# Patient Record
Sex: Female | Born: 1980 | Race: White | Hispanic: No | Marital: Married | Smoking: Never smoker
Health system: Southern US, Community
[De-identification: ages and names within clinical notes are randomized; demographics above are authoritative.]

## PROBLEM LIST (undated history)

## (undated) ENCOUNTER — Inpatient Hospital Stay (HOSPITAL_COMMUNITY): Payer: BLUE CROSS/BLUE SHIELD | Admitting: Obstetrics and Gynecology

## (undated) DIAGNOSIS — R0600 Dyspnea, unspecified: Secondary | ICD-10-CM

## (undated) DIAGNOSIS — I471 Supraventricular tachycardia: Secondary | ICD-10-CM

## (undated) DIAGNOSIS — I4719 Other supraventricular tachycardia: Secondary | ICD-10-CM

## (undated) HISTORY — DX: Other supraventricular tachycardia: I47.19

## (undated) HISTORY — DX: Dyspnea, unspecified: R06.00

## (undated) HISTORY — DX: Supraventricular tachycardia: I47.1

---

## 2003-04-21 ENCOUNTER — Other Ambulatory Visit: Admission: RE | Admit: 2003-04-21 | Discharge: 2003-04-21 | Payer: Self-pay | Admitting: Obstetrics and Gynecology

## 2004-05-30 ENCOUNTER — Other Ambulatory Visit: Admission: RE | Admit: 2004-05-30 | Discharge: 2004-05-30 | Payer: Self-pay | Admitting: Obstetrics and Gynecology

## 2005-08-27 ENCOUNTER — Other Ambulatory Visit: Admission: RE | Admit: 2005-08-27 | Discharge: 2005-08-27 | Payer: Self-pay | Admitting: Obstetrics and Gynecology

## 2007-04-20 ENCOUNTER — Ambulatory Visit: Payer: Self-pay | Admitting: Internal Medicine

## 2007-04-26 ENCOUNTER — Ambulatory Visit: Payer: Self-pay | Admitting: Cardiology

## 2007-06-03 ENCOUNTER — Ambulatory Visit: Payer: Self-pay | Admitting: Internal Medicine

## 2007-09-28 ENCOUNTER — Ambulatory Visit: Payer: Self-pay | Admitting: Internal Medicine

## 2008-09-14 ENCOUNTER — Ambulatory Visit: Payer: Self-pay | Admitting: Internal Medicine

## 2009-09-06 DIAGNOSIS — I471 Supraventricular tachycardia: Secondary | ICD-10-CM | POA: Insufficient documentation

## 2009-09-06 DIAGNOSIS — I498 Other specified cardiac arrhythmias: Secondary | ICD-10-CM

## 2009-09-07 ENCOUNTER — Ambulatory Visit: Payer: Self-pay | Admitting: Internal Medicine

## 2009-11-07 ENCOUNTER — Telehealth (INDEPENDENT_AMBULATORY_CARE_PROVIDER_SITE_OTHER): Payer: Self-pay | Admitting: *Deleted

## 2010-06-28 ENCOUNTER — Ambulatory Visit: Payer: Self-pay | Admitting: Internal Medicine

## 2010-07-21 ENCOUNTER — Inpatient Hospital Stay (HOSPITAL_COMMUNITY): Admission: AD | Admit: 2010-07-21 | Discharge: 2010-07-24 | Payer: Self-pay | Admitting: Obstetrics and Gynecology

## 2010-10-29 NOTE — Assessment & Plan Note (Signed)
Summary: f69m/per pt call she is due to have a baby on 10/15/lg  Medications Added PRE-NATAL FORMULA  TABS (PRENATAL MULTIVIT-MIN-FE-FA) 1  tab by mouth once daily        CC:  check up.  History of Present Illness: Vickie Short is seen in followup for  left atrial tachycardia.   She also has a history of intermittent elevations of blood pressure. interestingly, during her pregnancy which is about to be complete, her blood pressure and her palpitations have been quiescent     Current Medications (verified): 1)  Pre-Natal Formula  Tabs (Prenatal Multivit-Min-Fe-Fa) .Marland Kitchen.. 1  Tab By Mouth Once Daily  Allergies: No Known Drug Allergies  Past History:  Past Medical History: Last updated: 09/06/2009  1. Left atrial tachycardia.   2. Some dyspnea associated with #1.   Past Surgical History: Last updated: 09/06/2009 NONE  Family History: Last updated: 09/06/2009  There   is no family history of atrial fibrillation or sudden death apart from   her father, who is status post a failed atrial ablation procedure in   1992 by Dr. Moody Bruins.   Social History: Last updated: 09/06/2009   She is to get married, herself, in the fall; her fiance is a   Counselling psychologist.  She notes no effects from caffeine, over-the-counter   cold medicines, or chocolate.      Vital Signs:  Patient profile:   30 year old female Height:      69 inches Weight:      196 pounds BMI:     29.05 Pulse rate:   78 / minute Resp:     14 per minute BP sitting:   114 / 77  (left arm)  Vitals Entered By: Kem Parkinson (June 28, 2010 11:01 AM)  Physical Exam  General:  The patient was alert and oriented in no acute distress. HEENT Normal.  Neck veins were flat, carotids were brisk.  Lungs were clear.  Heart sounds were regular without murmurs or gallops.  Abdomen is gravid  There is no clubbing cyanosis or edema. Skin Warm and dry    Impression & Recommendations:  Problem # 1:  ATRIAL  TACHYCARDIA (ICD-427.89) the patient's symptoms have been quiet during her pregnancy. She is currently not taking a tubule. She is going to plan to breast-feed. I would anticipate that likely she will maintain her "pregnancy" state and hopefully it will keep her rhythms quiet also. The following medications were removed from the medication list:    Acebutolol Hcl 200 Mg Caps (Acebutolol hcl) .Marland Kitchen... Take one tablet once daily  Patient Instructions: 1)  Your physician recommends that you continue on your current medications as directed. Please refer to the Current Medication list given to you today. 2)  Your physician wants you to follow-up in: 1 year  You will receive a reminder letter in the mail two months in advance. If you don't receive a letter, please call our office to schedule the follow-up appointment.

## 2010-10-29 NOTE — Progress Notes (Signed)
   Recieved request from [facility] [employer] forwarded to [place] for processing [facility]  November 07, 2009 1:06 PM

## 2010-12-11 LAB — RH IMMUNE GLOB WKUP(>/=20WKS)(NOT WOMEN'S HOSP): Unit division: 0

## 2010-12-11 LAB — CBC
HCT: 32.8 % — ABNORMAL LOW (ref 36.0–46.0)
Hemoglobin: 11.1 g/dL — ABNORMAL LOW (ref 12.0–15.0)
Hemoglobin: 12.7 g/dL (ref 12.0–15.0)
MCH: 31 pg (ref 26.0–34.0)
MCH: 31.5 pg (ref 26.0–34.0)
MCHC: 33.8 g/dL (ref 30.0–36.0)
MCHC: 33.8 g/dL (ref 30.0–36.0)
MCV: 91.9 fL (ref 78.0–100.0)
MCV: 93 fL (ref 78.0–100.0)
RBC: 4.1 MIL/uL (ref 3.87–5.11)

## 2011-02-11 NOTE — Assessment & Plan Note (Signed)
[facility]                         ELECTROPHYSIOLOGY OFFICE NOTE   NAME:Nase, TIEISHA DARDEN                      MRN:          [id number]  DATE:09/28/2007                            DOB:          August 25, 1981    Ms. Marianne Sofia Altizer, is doing pretty well on her p.r.n. verapamil for  her left atrial tachycardia.  We discussed a number of issues including  pregnancy, flying, and long-term consequences.  I told her that I am not  sure that any of those are going to be issues.  There may be some  association between atrial tachycardia and atrial fibrillation, but at  her age, I think that would not be significant.  As long as she  minimally symptomatic, I am not sure that I would pursue anything other  than her current therapy.  As relates to pregnancy, there is some  anticipation that tachycardia will get worse in pregnancy, particularly  AV reentrant arrhythmias, and this may well apply to atrial tachycardias  as well, especially given the blood volume increases associated with  pregnancy.   We will plan to see her again in 12 months' time, and she is to call us  if there are any issues in the interim.     Duke Salvia, MD, [facility]  Electronically Signed    SCK/MedQ  DD: 09/28/2007  DT: 09/29/2007  Job #: [id number]

## 2011-02-11 NOTE — Letter (Signed)
September 14, 2008    [name], MD  [address]  [place], [place] [address]   [name], [name]  MRN:  [id number]  /  DOB:  11-06-1980   [name],   Vickie Short came in with her husband today in followup for her left  atrial tachycardia.  She continues to do quite well on her acebutolol  with only rare palpitation.  She also has had problems with headaches  and in the past these have correlated with hypertension.  She identified  by taking her blood pressure with her father's blood pressure cuff.  It  is not clear whether this is continuing to be an issue as head feels  heavy, but I thought there is something worth doing as when we saw her  a year ago, her blood pressure was 143/89.  Her weight is down 6 pounds  since then her blood pressure is 124/64.  Her pulse is 63.  Her lungs  were clear.  Heart sounds were regular.  Neck veins were flat and  extremities were without edema.   I should note that her medication include acebutolol 200 b.i.d. which is  related to pregnancy and in fact it is a category B drug.   Electrocardiogram demonstrated sinus rhythm 63 with intervals of  0.12/0.8/0.39, the axis was 60 degrees.  There is no evidence of  ventricular preexcitation.   IMPRESSION:  1. Left atrial tachycardiac.  2. (?) Hypertension.   Vickie Short, is doing well.  I just wanted to keep you abreast of  what were doing.  We will plan to see her again in a year.  She did  raise a question about whether she would like her father would be a  candidate for ablation, certainly I think that would be the case if her  symptoms were significant and if it became clear that he did not need  therapy anyway for her blood pressure.    Sincerely,      Duke Salvia, MD, [facility]  Electronically Signed    SCK/MedQ  DD: 09/14/2008  DT: 09/15/2008  Job #: [id number]

## 2011-02-11 NOTE — Assessment & Plan Note (Signed)
[facility]                         ELECTROPHYSIOLOGY OFFICE NOTE   Vickie Short, Vickie Short                    MRN:          [id number]  DATE:06/03/2007                            DOB:          April 09, 1981    Vickie Short came in today for follow-up of her left atrial  tachycardia.  We had tried her on Atenolol, metoprolol, Succinate and  Inderal.  The metoprolol succinate was terrible.  The atenolol was  associated with some vague chest pain and the Inderal was associated  with some fatigue.  Both the latter two had some improvement on her  palpitations.   She has decided that she would like to take medication p.r.n. for right  now.  I have given her a prescription for Inderal 20 and Verapamil 40 to  take one repeated 30 minutes later with another if she is having a bad  day.   I will plan to see her in December following her marriage in October.     Duke Salvia, MD, [facility]  Electronically Signed    SCK/MedQ  DD: 06/03/2007  DT: 06/04/2007  Job #: [id number]   cc:   Alta Corning, M.D.

## 2011-02-11 NOTE — Letter (Signed)
April 20, 2007    Alta Corning, M.D.  [facility]  [address]  [place], [place] [address]   RE:  MCKINZIE, SAKSA  MRN:  [id number]  /  DOB:  12/24/1980   [name],   It was a pleasure to your patient Newell Frater today in consultation  because of her irregular heartbeat.   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  Over the last number of months they have gotten worse;  and over the last number of weeks they have gotten worse still.  The  latter change concurrent with the introduction of birth control therapy.   The increasing symptoms relate both to the change in frequency as well  as shortness of breath associated with these.  She is most aware of  these in the evening, and then going to bed.  She actually finds that  these symptoms abate during exercise.   There are no symptoms of heart failure, specifically no dyspnea on  exertion, nocturnal dyspnea, or swelling.  There is no syncope.  There  is no family history of atrial fibrillation or sudden death apart from  her father, who is status post a failed atrial ablation procedure in  1992 by Dr. Moody Bruins.   She notes that these episodes are worse or increased with stress. Her  sister recently got married; she tried to lose weight to fit into her  dress.  She is to get married, herself, in the fall; her fiance is a  Counselling psychologist.  She notes no effects from caffeine, over-the-counter  cold medicines, or chocolate.   These episodes are occasionally associated with a discomfort in her neck  bilaterally.  As noted, these are unrelated to exercise.   Her social history is as noted previously.  She does not use cigarettes,  alcohol, or recreational drugs.   Her past surgical history is negative.  Her review of systems apart from  the above is negative.  Her family history is noncontributory.   Her current medications are none.  She has no known drug allergies.   On examination she is a young Caucasian female appearing her stated age  of 22.  Blood pressure is 126/82, pulse is 80.  Weight was 166.  Her  HEENT exam demonstrated no icterus or xanthoma.  The neck veins were  flat.  Carotids were brisk and full bilaterally without bruits.  The  back was without kyphosis or scoliosis.  The lungs were clear.  Heart  sounds were irregular with an early systolic murmur.  There were no  gallops. The abdomen was soft with active bowel sounds without midline  pulsations or hepatomegaly.  Femoral pulses were 2+, distal pulses were  intact.  There was no clubbing, cyanosis, or edema.  The neurological  exam was grossly normal.  Her skin was warm and dry.   Electrocardiogram dated, today, demonstrated a sinus rhythm at a rate of  about 70.  There were frequent atrial ectopics which mostly had a  negative vector in lead aVL; and was relatively isoelectric in lead V-1.   The strips that you so kindly sent also show something quite similar in  aVL that is mostly negative P waves in that lead.   IMPRESSION:  1. Left atrial tachycardia.  2. Some dyspnea associated with #1.   Jesusita Oka, Ms. Altizer's ectopy appears to be left atrial.  There is a ring  of fire for origin of atrial tachycardias that surround the mitral  annulus.  The upright vector in the inferior leads would suggest that  this is not the origin, that they may be coming from higher up; and in  fact may be coming from the pulmonary veins.  Her family history is  interesting, with her father, as there is increasing data related to  genetic contributions to atrial fibrillation.  The family history;  however, does not extend beyond these two, so it is hard to go too far  with that.   From a diagnostic point of view, I think, that it is important to make  sure that her heart is structurally normal; and we have taken the  liberty of  ordering an ultrasound.  She will be in town, here in  [place], the next two days so we can just set that up here for  simplicity's sake.  I have also given her prescriptions for 4 different  medications; to see if we can have a therapeutic impact.  Significantly  these include Inderal LA 60, atenolol 25, metoprolol succinate 25 and/or  verapamil 120.  I shall take them in some random order, and see if any  of these do not help ameliorate the situation.   I will plan to see her back in about four to five weeks to see how we  are doing.  She is to call if she has any more problems.   Jesusita Oka, thank you very much for asking [place] to see her.    Sincerely,      Duke Salvia, MD, [facility]  Electronically Signed    SCK/MedQ  DD: 04/20/2007  DT: 04/21/2007  Job #: [id number]

## 2011-03-27 ENCOUNTER — Encounter: Payer: Self-pay | Admitting: Internal Medicine

## 2011-08-27 ENCOUNTER — Ambulatory Visit: Payer: Self-pay | Admitting: Internal Medicine

## 2011-09-30 NOTE — L&D Delivery Note (Signed)
Delivery Note At 10:38 AM a viable and healthy female was delivered via Vaginal, Spontaneous Delivery (Presentation: Middle Occiput Anterior).  APGAR: 6, 9; weight 8 lb 15.2 oz (4060 g).   Placenta status: Intact, Spontaneous.  Cord: 3 vessels with a loose nuchal cord x1  Anesthesia: Epidural  Episiotomy: None Lacerations: 2nd degree;Perineal Suture Repair: 3.0 vicryl Est. Blood Loss (mL): 350 cc  Mom to postpartum.  Baby to nursery-stable.  Royce Stegman H. 09/23/2012, 11:04 AM

## 2011-10-14 ENCOUNTER — Ambulatory Visit (INDEPENDENT_AMBULATORY_CARE_PROVIDER_SITE_OTHER): Payer: BC Managed Care – PPO | Admitting: Internal Medicine

## 2011-10-14 ENCOUNTER — Encounter: Payer: Self-pay | Admitting: Internal Medicine

## 2011-10-14 VITALS — BP 110/78 | HR 66 | Ht 69.0 in | Wt 169.0 lb

## 2011-10-14 DIAGNOSIS — I498 Other specified cardiac arrhythmias: Secondary | ICD-10-CM

## 2011-10-14 NOTE — Progress Notes (Signed)
   HPI  Vickie Short is a 31 y.o. female seen in followup for left atrial tachycardia.  She also has a history of intermittent elevations of blood pressure. interestingly, during her pregnancy which is about to be complete, her blood pressure and her palpitations have been quiescent   Past Medical History  Diagnosis Date  . Atrial tachycardia     left atrial  . Dyspnea     associated with #1    No past surgical history on file.  Current Outpatient Prescriptions  Medication Sig Dispense Refill  . Prenatal Multivit-Min-Fe-FA (PRE-NATAL FORMULA) TABS Take 1 tablet by mouth daily.          No Known Allergies  Review of Systems negative except from HPI and PMH  Physical Exam BP 110/78  Pulse 66  Ht 5\' 9"  (1.753 m)  Wt 169 lb (76.658 kg)  BMI 24.96 kg/m2 Well developed and nourished in no acute distress HENT normal Neck supple with JVP-flat Carotids brisk and full without bruits Clear Regular rate and rhythm, no murmurs or gallops Abd-soft with active BS without hepatomegaly No Clubbing cyanosis edema Skin-warm and dry A & Oriented  Grossly normal sensory and motor function   Assessment and  Plan

## 2011-10-14 NOTE — Assessment & Plan Note (Signed)
Stable

## 2012-02-03 LAB — OB RESULTS CONSOLE GC/CHLAMYDIA: Chlamydia: NEGATIVE

## 2012-03-03 LAB — OB RESULTS CONSOLE RUBELLA ANTIBODY, IGM: Rubella: IMMUNE

## 2012-03-03 LAB — OB RESULTS CONSOLE HIV ANTIBODY (ROUTINE TESTING): HIV: NONREACTIVE

## 2012-03-03 LAB — OB RESULTS CONSOLE RPR: RPR: NONREACTIVE

## 2012-03-03 LAB — OB RESULTS CONSOLE HEPATITIS B SURFACE ANTIGEN: Hepatitis B Surface Ag: NEGATIVE

## 2012-09-22 ENCOUNTER — Inpatient Hospital Stay (HOSPITAL_COMMUNITY)
Admission: AD | Admit: 2012-09-22 | Discharge: 2012-09-24 | DRG: 373 | Disposition: A | Discharge status: Home / Self Care | Payer: BC Managed Care – PPO | Source: Ambulatory Visit | Attending: Obstetrics & Gynecology | Admitting: Obstetrics & Gynecology

## 2012-09-22 ENCOUNTER — Encounter (HOSPITAL_COMMUNITY): Payer: Self-pay | Admitting: *Deleted

## 2012-09-22 DIAGNOSIS — O99892 Other specified diseases and conditions complicating childbirth: Secondary | ICD-10-CM | POA: Diagnosis present

## 2012-09-22 DIAGNOSIS — Z22330 Carrier of Group B streptococcus: Secondary | ICD-10-CM

## 2012-09-22 DIAGNOSIS — O480 Post-term pregnancy: Principal | ICD-10-CM | POA: Diagnosis present

## 2012-09-22 LAB — CBC
HCT: 34.2 % — ABNORMAL LOW (ref 36.0–46.0)
Hemoglobin: 11.4 g/dL — ABNORMAL LOW (ref 12.0–15.0)
MCH: 29.6 pg (ref 26.0–34.0)
MCV: 88.8 fL (ref 78.0–100.0)
Platelets: 180 10*3/uL (ref 150–400)
RBC: 3.85 MIL/uL — ABNORMAL LOW (ref 3.87–5.11)

## 2012-09-22 LAB — ABO/RH: ABO/RH(D): A NEG

## 2012-09-22 MED ORDER — OXYTOCIN BOLUS FROM INFUSION: 500.0000 mL | INTRAVENOUS | Status: DC

## 2012-09-22 MED ORDER — PENICILLIN G POTASSIUM 5000000 UNITS IJ SOLR
5.0000 10*6.[IU] | Freq: Once | INTRAVENOUS | Status: AC
  Administered 2012-09-22: 5 10*6.[IU] via INTRAVENOUS
  Filled 2012-09-22: qty 5

## 2012-09-22 MED ORDER — OXYTOCIN 40 UNITS IN LACTATED RINGERS INFUSION - SIMPLE MED
1.0000 m[IU]/min | INTRAVENOUS | Status: DC
  Filled 2012-09-22: qty 1000

## 2012-09-22 MED ORDER — OXYCODONE-ACETAMINOPHEN 5-325 MG PO TABS: 1.0000 | ORAL_TABLET | ORAL | Status: DC | PRN

## 2012-09-22 MED ORDER — LACTATED RINGERS IV SOLN: 500.0000 mL | INTRAVENOUS | Status: DC | PRN

## 2012-09-22 MED ORDER — LACTATED RINGERS IV SOLN
INTRAVENOUS | Status: DC
  Administered 2012-09-22 – 2012-09-23 (×3): via INTRAVENOUS

## 2012-09-22 MED ORDER — MISOPROSTOL 25 MCG QUARTER TABLET
25.0000 ug | ORAL_TABLET | ORAL | Status: DC | PRN
  Administered 2012-09-22 – 2012-09-23 (×2): 25 ug via VAGINAL
  Filled 2012-09-22 (×2): qty 0.25

## 2012-09-22 MED ORDER — ACETAMINOPHEN 325 MG PO TABS: 650.0000 mg | ORAL_TABLET | ORAL | Status: DC | PRN

## 2012-09-22 MED ORDER — CITRIC ACID-SODIUM CITRATE 334-500 MG/5ML PO SOLN: 30.0000 mL | ORAL | Status: DC | PRN

## 2012-09-22 MED ORDER — IBUPROFEN 600 MG PO TABS: 600.0000 mg | ORAL_TABLET | Freq: Four times a day (QID) | ORAL | Status: DC | PRN

## 2012-09-22 MED ORDER — ONDANSETRON HCL 4 MG/2ML IJ SOLN: 4.0000 mg | Freq: Four times a day (QID) | INTRAMUSCULAR | Status: DC | PRN

## 2012-09-22 MED ORDER — LIDOCAINE HCL (PF) 1 % IJ SOLN
30.0000 mL | INTRAMUSCULAR | Status: DC | PRN
  Filled 2012-09-22: qty 30

## 2012-09-22 MED ORDER — OXYTOCIN 40 UNITS IN LACTATED RINGERS INFUSION - SIMPLE MED: 62.5000 mL/h | INTRAVENOUS | Status: DC

## 2012-09-22 MED ORDER — BUTORPHANOL TARTRATE 1 MG/ML IJ SOLN: 1.0000 mg | INTRAMUSCULAR | Status: DC | PRN

## 2012-09-22 MED ORDER — TERBUTALINE SULFATE 1 MG/ML IJ SOLN: 0.2500 mg | Freq: Once | INTRAMUSCULAR | Status: AC | PRN

## 2012-09-22 MED ORDER — PENICILLIN G POTASSIUM 5000000 UNITS IJ SOLR
2.5000 10*6.[IU] | INTRAVENOUS | Status: DC
  Administered 2012-09-23 (×3): 2.5 10*6.[IU] via INTRAVENOUS
  Filled 2012-09-22 (×5): qty 2.5

## 2012-09-22 NOTE — H&P (Signed)
31 y.o. G2P1001  Estimated Date of Delivery: None noted. admitted at [redacted] weeks gestation for induction.  Prenatal Transfer Tool  Maternal Diabetes: No Genetic Screening: Declined Maternal Ultrasounds/Referrals: Normal Fetal Ultrasounds or other Referrals:  None Maternal Substance Abuse:  No Significant Maternal Medications:  None Significant Maternal Lab Results: Lab values include: Group B Strep positive, Rh negative (husband Rh negative as well). Other Significant Pregnancy Complications:  Post dates  Afebrile, VSS Heart and Lungs: No active disease Abdomen: soft, gravid, EFW possible LGA. Cervical exam:  1/60 vtx -1/-2  Impression: Post dates pregnancy with unfavorable cervix  Plan:  Cytotec/pitocin induction

## 2012-09-23 ENCOUNTER — Inpatient Hospital Stay (HOSPITAL_COMMUNITY): Payer: BC Managed Care – PPO | Admitting: Anesthesiology

## 2012-09-23 ENCOUNTER — Encounter (HOSPITAL_COMMUNITY): Payer: Self-pay | Admitting: *Deleted

## 2012-09-23 ENCOUNTER — Encounter (HOSPITAL_COMMUNITY): Payer: Self-pay | Admitting: Anesthesiology

## 2012-09-23 MED ORDER — FENTANYL 2.5 MCG/ML BUPIVACAINE 1/10 % EPIDURAL INFUSION (WH - ANES)
14.0000 mL/h | INTRAMUSCULAR | Status: DC
  Administered 2012-09-23: 14 mL/h via EPIDURAL
  Filled 2012-09-23: qty 125

## 2012-09-23 MED ORDER — SIMETHICONE 80 MG PO CHEW: 80.0000 mg | CHEWABLE_TABLET | ORAL | Status: DC | PRN

## 2012-09-23 MED ORDER — ONDANSETRON HCL 4 MG PO TABS: 4.0000 mg | ORAL_TABLET | ORAL | Status: DC | PRN

## 2012-09-23 MED ORDER — IBUPROFEN 600 MG PO TABS
600.0000 mg | ORAL_TABLET | Freq: Four times a day (QID) | ORAL | Status: DC
  Administered 2012-09-23 – 2012-09-24 (×5): 600 mg via ORAL
  Filled 2012-09-23 (×5): qty 1

## 2012-09-23 MED ORDER — SENNOSIDES-DOCUSATE SODIUM 8.6-50 MG PO TABS
2.0000 | ORAL_TABLET | Freq: Every day | ORAL | Status: DC
  Administered 2012-09-23: 2 via ORAL

## 2012-09-23 MED ORDER — PHENYLEPHRINE 40 MCG/ML (10ML) SYRINGE FOR IV PUSH (FOR BLOOD PRESSURE SUPPORT)
80.0000 ug | PREFILLED_SYRINGE | INTRAVENOUS | Status: DC | PRN
  Filled 2012-09-23: qty 5

## 2012-09-23 MED ORDER — ZOLPIDEM TARTRATE 5 MG PO TABS: 5.0000 mg | ORAL_TABLET | Freq: Every evening | ORAL | Status: DC | PRN

## 2012-09-23 MED ORDER — LANOLIN HYDROUS EX OINT: TOPICAL_OINTMENT | CUTANEOUS | Status: DC | PRN

## 2012-09-23 MED ORDER — EPHEDRINE 5 MG/ML INJ: 10.0000 mg | INTRAVENOUS | Status: DC | PRN

## 2012-09-23 MED ORDER — OXYCODONE-ACETAMINOPHEN 5-325 MG PO TABS
1.0000 | ORAL_TABLET | ORAL | Status: DC | PRN
  Administered 2012-09-24: 1 via ORAL
  Filled 2012-09-23: qty 1

## 2012-09-23 MED ORDER — BENZOCAINE-MENTHOL 20-0.5 % EX AERO
1.0000 "application " | INHALATION_SPRAY | CUTANEOUS | Status: DC | PRN
  Filled 2012-09-23 (×2): qty 56

## 2012-09-23 MED ORDER — PRENATAL MULTIVITAMIN CH
1.0000 | ORAL_TABLET | Freq: Every day | ORAL | Status: DC
  Administered 2012-09-24: 1 via ORAL
  Filled 2012-09-23: qty 1

## 2012-09-23 MED ORDER — DIPHENHYDRAMINE HCL 25 MG PO CAPS: 25.0000 mg | ORAL_CAPSULE | Freq: Four times a day (QID) | ORAL | Status: DC | PRN

## 2012-09-23 MED ORDER — WITCH HAZEL-GLYCERIN EX PADS: 1.0000 "application " | MEDICATED_PAD | CUTANEOUS | Status: DC | PRN

## 2012-09-23 MED ORDER — LIDOCAINE HCL (PF) 1 % IJ SOLN
INTRAMUSCULAR | Status: DC | PRN
  Administered 2012-09-23 (×4): 4 mL

## 2012-09-23 MED ORDER — LACTATED RINGERS IV SOLN
500.0000 mL | Freq: Once | INTRAVENOUS | Status: AC
  Administered 2012-09-23: 500 mL via INTRAVENOUS

## 2012-09-23 MED ORDER — DIBUCAINE 1 % RE OINT
1.0000 "application " | TOPICAL_OINTMENT | RECTAL | Status: DC | PRN
  Filled 2012-09-23: qty 28

## 2012-09-23 MED ORDER — TETANUS-DIPHTH-ACELL PERTUSSIS 5-2.5-18.5 LF-MCG/0.5 IM SUSP
0.5000 mL | Freq: Once | INTRAMUSCULAR | Status: AC
  Administered 2012-09-23: 0.5 mL via INTRAMUSCULAR
  Filled 2012-09-23: qty 0.5

## 2012-09-23 MED ORDER — EPHEDRINE 5 MG/ML INJ
10.0000 mg | INTRAVENOUS | Status: DC | PRN
  Filled 2012-09-23: qty 4

## 2012-09-23 MED ORDER — PHENYLEPHRINE 40 MCG/ML (10ML) SYRINGE FOR IV PUSH (FOR BLOOD PRESSURE SUPPORT): 80.0000 ug | PREFILLED_SYRINGE | INTRAVENOUS | Status: DC | PRN

## 2012-09-23 MED ORDER — ONDANSETRON HCL 4 MG/2ML IJ SOLN: 4.0000 mg | INTRAMUSCULAR | Status: DC | PRN

## 2012-09-23 MED ORDER — DIPHENHYDRAMINE HCL 50 MG/ML IJ SOLN: 12.5000 mg | INTRAMUSCULAR | Status: DC | PRN

## 2012-09-23 NOTE — Anesthesia Preprocedure Evaluation (Signed)
Anesthesia Evaluation  Patient identified by MRN, date of birth, ID band Patient awake    Reviewed: Allergy & Precautions, H&P , NPO status , Patient's Chart, lab work & pertinent test results, reviewed documented beta blocker date and time   History of Anesthesia Complications Negative for: history of anesthetic complications  Airway Mallampati: I TM Distance: >3 FB Neck ROM: full    Dental  (+) Teeth Intact   Pulmonary neg pulmonary ROS,  breath sounds clear to auscultation        Cardiovascular + dysrhythmias (h/o atrial tachycardia, stable, no meds) Rhythm:regular Rate:Normal     Neuro/Psych negative neurological ROS  negative psych ROS   GI/Hepatic negative GI ROS, Neg liver ROS,   Endo/Other  negative endocrine ROS  Renal/GU negative Renal ROS  negative genitourinary   Musculoskeletal   Abdominal   Peds  Hematology negative hematology ROS (+)   Anesthesia Other Findings   Reproductive/Obstetrics (+) Pregnancy                           Anesthesia Physical Anesthesia Plan  ASA: II  Anesthesia Plan: Epidural   Post-op Pain Management:    Induction:   Airway Management Planned:   Additional Equipment:   Intra-op Plan:   Post-operative Plan:   Informed Consent: I have reviewed the patients History and Physical, chart, labs and discussed the procedure including the risks, benefits and alternatives for the proposed anesthesia with the patient or authorized representative who has indicated his/her understanding and acceptance.     Plan Discussed with:   Anesthesia Plan Comments:         Anesthesia Quick Evaluation

## 2012-09-23 NOTE — Anesthesia Procedure Notes (Signed)
Epidural Patient location during procedure: OB Start time: 09/23/2012 6:38 AM  Staffing Performed by: anesthesiologist   Preanesthetic Checklist Completed: patient identified, site marked, surgical consent, pre-op evaluation, timeout performed, IV checked, risks and benefits discussed and monitors and equipment checked  Epidural Patient position: sitting Prep: site prepped and draped and DuraPrep Patient monitoring: continuous pulse ox and blood pressure Approach: midline Injection technique: LOR air  Needle:  Needle type: Tuohy  Needle gauge: 17 G Needle length: 9 cm and 9 Needle insertion depth: 4.5 cm Catheter type: closed end flexible Catheter size: 19 Gauge Catheter at skin depth: 9.5 cm Test dose: negative  Assessment Events: blood not aspirated, injection not painful, no injection resistance, negative IV test and no paresthesia  Additional Notes Discussed risk of headache, infection, bleeding, nerve injury and failed or incomplete block.  Patient voices understanding and wishes to proceed.  Epidural placed easily on first attempt.  Patient tolerated procedure well with no apparent complications.  Jasmine December, MD Reason for block:procedure for pain

## 2012-09-23 NOTE — Progress Notes (Signed)
Patient ID: Vickie Short, female   DOB: [birth year], 31 y.o.   MRN: [id number]  S: Comfortable, feeling some pressure  O: Filed Vitals:   09/23/12 0727 09/23/12 0732 09/23/12 0802 09/23/12 0830  BP: 128/72 124/70 114/65 129/74  Pulse: 75 75 91 84  Temp:   98 F (36.7 C)   TempSrc:   Oral   Resp: 18 20 20 20   Height:      Weight:      SpO2:       Cvx C/C/BBOW FHT 140-150 reactive toco Q2-3  AROM clear fluid  A/P Labor Down FWB reassuring

## 2012-09-23 NOTE — Anesthesia Postprocedure Evaluation (Signed)
  Anesthesia Post-op Note  Patient: Vickie Short  Procedure(s) Performed: * No procedures listed *  Patient Location: PACU and Mother/Baby  Anesthesia Type:Epidural  Level of Consciousness: awake, alert  and oriented  Airway and Oxygen Therapy: Patient Spontanous Breathing  Post-op Pain: none  Post-op Assessment: Post-op Vital signs reviewed, Patient's Cardiovascular Status Stable, No headache, No backache, No residual numbness and No residual motor weakness  Post-op Vital Signs: Reviewed and stable  Complications: No apparent anesthesia complications

## 2012-09-23 NOTE — Progress Notes (Signed)
Pt contracting too much to place another cytotec at this time, will continue to monitor uc pattern.

## 2012-09-24 LAB — CBC
HCT: 30.3 % — ABNORMAL LOW (ref 36.0–46.0)
Hemoglobin: 10.1 g/dL — ABNORMAL LOW (ref 12.0–15.0)
MCH: 29.7 pg (ref 26.0–34.0)
MCHC: 33.3 g/dL (ref 30.0–36.0)
RDW: 13.6 % (ref 11.5–15.5)

## 2012-09-24 MED ORDER — IBUPROFEN 600 MG PO TABS: 600.0000 mg | ORAL_TABLET | Freq: Four times a day (QID) | ORAL | Status: DC

## 2012-09-24 MED ORDER — OXYCODONE-ACETAMINOPHEN 5-325 MG PO TABS: 1.0000 | ORAL_TABLET | ORAL | Status: DC | PRN

## 2012-09-24 NOTE — Discharge Summary (Signed)
Obstetric Discharge Summary Reason for Admission: induction of labor Prenatal Procedures: none Intrapartum Procedures: spontaneous vaginal delivery Postpartum Procedures:none Complications-Operative and Postpartum: 2nd degree perineal laceration Hemoglobin  Date Value Range Status  09/24/2012 10.1* 12.0 - 15.0 g/dL Final     HCT  Date Value Range Status  09/24/2012 30.3* 36.0 - 46.0 % Final    Physical Exam:  General: alert and cooperative Lochia: appropriate Uterine Fundus: firm DVT Evaluation: No evidence of DVT seen on physical exam.  Discharge Diagnoses: Term Pregnancy-delivered  Discharge Information: Date: 09/24/2012 Activity: pelvic rest Diet: routine Medications: PNV, Ibuprofen, Colace and Percocet Condition: stable Instructions: refer to practice specific booklet Discharge to: home Follow-up Information    Follow up with Almon Hercules., MD. In 4 weeks.   Contact information:   [address] [place] [address] [phone]          Newborn Data: Live born female  Birth Weight: 8 lb 15.2 oz (4060 g) APGAR: 6, 9  Home with mother.  Vickie Short 09/24/2012, 9:46 AM

## 2013-10-21 ENCOUNTER — Other Ambulatory Visit: Payer: Self-pay

## 2013-10-21 ENCOUNTER — Other Ambulatory Visit: Payer: Self-pay | Admitting: Obstetrics and Gynecology

## 2013-10-21 DIAGNOSIS — N6325 Unspecified lump in the left breast, overlapping quadrants: Secondary | ICD-10-CM

## 2013-10-21 DIAGNOSIS — N6323 Unspecified lump in the left breast, lower outer quadrant: Secondary | ICD-10-CM

## 2013-10-21 DIAGNOSIS — N6320 Unspecified lump in the left breast, unspecified quadrant: Secondary | ICD-10-CM

## 2013-10-27 ENCOUNTER — Ambulatory Visit
Admission: RE | Admit: 2013-10-27 | Discharge: 2013-10-27 | Disposition: A | Discharge status: Home / Self Care | Payer: BC Managed Care – PPO | Source: Ambulatory Visit | Attending: Obstetrics and Gynecology | Admitting: Obstetrics and Gynecology

## 2013-10-27 DIAGNOSIS — N6325 Unspecified lump in the left breast, overlapping quadrants: Secondary | ICD-10-CM

## 2013-10-27 DIAGNOSIS — N6323 Unspecified lump in the left breast, lower outer quadrant: Secondary | ICD-10-CM

## 2013-10-27 DIAGNOSIS — N6320 Unspecified lump in the left breast, unspecified quadrant: Secondary | ICD-10-CM

## 2014-07-31 ENCOUNTER — Encounter (HOSPITAL_COMMUNITY): Payer: Self-pay | Admitting: *Deleted

## 2015-01-23 ENCOUNTER — Telehealth: Payer: Self-pay | Admitting: Internal Medicine

## 2015-01-23 NOTE — Telephone Encounter (Signed)
New message ° ° ° ° ° ° °Pt returning nurse call  °

## 2015-01-24 NOTE — Telephone Encounter (Signed)
Patient was calling about her dad Vickie Short, DOB: 09/25/41. See his chart for documentation of phone call.

## 2015-02-06 ENCOUNTER — Ambulatory Visit: Payer: Self-pay | Admitting: Cardiology

## 2015-02-21 ENCOUNTER — Encounter: Payer: Self-pay | Admitting: Internal Medicine

## 2015-02-21 ENCOUNTER — Ambulatory Visit (INDEPENDENT_AMBULATORY_CARE_PROVIDER_SITE_OTHER): Payer: BLUE CROSS/BLUE SHIELD | Admitting: Internal Medicine

## 2015-02-21 VITALS — BP 120/80 | HR 79 | Ht 68.0 in | Wt 180.4 lb

## 2015-02-21 DIAGNOSIS — I471 Supraventricular tachycardia: Secondary | ICD-10-CM

## 2015-02-21 NOTE — Progress Notes (Signed)
      No care team member to display   HPI  Vickie JourneyRebecca C Ortloff is a 34 y.o. female Seen in follow-up for left atrial tachycardia and elevated blood pressure.  She notes palpitations. They cause her to have a unusual breathing sensation when she takes her pulse she notes the skin which appears to be right on time.  She's also had episodes where she has a sense of weakness and takes her blood pressure and it is elevated, in the 150-160 range. it comes down rapidly with rest.  She has noted above seemed to be somewhat better with the elimination of caffeine.  Past Medical History  Diagnosis Date  . Dyspnea     associated with #1  . Atrial tachycardia     left atrial    History reviewed. No pertinent past surgical history.  No current outpatient prescriptions on file.   No current facility-administered medications for this visit.    No Known Allergies  Review of Systems negative except from HPI and PMH  Physical Exam BP 120/80 mmHg  Pulse 79  Ht 5\' 8"  (1.727 m)  Wt 180 lb 6.4 oz (81.829 kg)  BMI 27.44 kg/m2 Well developed and well nourished in no acute distress HENT normal E scleral and icterus clear Neck Supple Clear to ausculation  Regular rate and rhythm, no murmurs gallops or rub Soft with active bowel sounds No clubbing cyanosis no Edema Alert and oriented, grossly normal motor and sensory function Skin Warm and Dry  ECG demonstrates sinus rhythm at 79% intervals 14/08/37 Axis XXIII  Assessment and  Plan  Palpitations  Elevated blood pressure  She has noted that palpitations are much less with the decreasing caffeine. By her description these are more consistent with PVCs in the atrial tachycardia previously noted.  The other concern is her elevated blood pressure. We will have her take her blood pressures on a regular basis every week or 2 for the next 4 months and then will review them. I wonder whether the elevations being brief are related to  stress.

## 2015-02-21 NOTE — Patient Instructions (Signed)
Medication Instructions:  Your physician recommends that you continue on your current medications as directed. Please refer to the Current Medication list given to you today.   Labwork: None  Testing/Procedures: None  Follow-Up: Your physician wants you to follow-up in: October with Dr. Graciela HusbandsKlein. You will receive a reminder letter in the mail two months in advance. If you don't receive a letter, please call our office to schedule the follow-up appointment.   Any Other Special Instructions Will Be Listed Below (If Applicable). None

## 2015-07-13 LAB — OB RESULTS CONSOLE HEPATITIS B SURFACE ANTIGEN: Hepatitis B Surface Ag: NEGATIVE

## 2015-07-13 LAB — OB RESULTS CONSOLE RUBELLA ANTIBODY, IGM: Rubella: IMMUNE

## 2015-07-13 LAB — OB RESULTS CONSOLE ABO/RH: RH TYPE: NEGATIVE

## 2015-07-13 LAB — OB RESULTS CONSOLE HIV ANTIBODY (ROUTINE TESTING): HIV: NONREACTIVE

## 2015-07-13 LAB — OB RESULTS CONSOLE GC/CHLAMYDIA
CHLAMYDIA, DNA PROBE: NEGATIVE
GC PROBE AMP, GENITAL: NEGATIVE

## 2015-07-13 LAB — OB RESULTS CONSOLE ANTIBODY SCREEN: Antibody Screen: NEGATIVE

## 2015-07-13 LAB — OB RESULTS CONSOLE RPR: RPR: NONREACTIVE

## 2015-09-30 NOTE — L&D Delivery Note (Signed)
Patient was C/C/+3 and pushed for 8 minutes with epidural.   NSVD  female infant, Apgars 8,9, weight P.   The patient had Short second degree midline perineal laceration repaired with 2-0 vicryl R. Fundus was firm. EBL was expected amount. Placenta was delivered intact. Vagina was clear.  Baby was vigorous and doing skin to skin with mother.  Vickie Short

## 2015-12-20 LAB — OB RESULTS CONSOLE GBS: STREP GROUP B AG: POSITIVE

## 2016-01-05 IMAGING — MG MM DIGITAL DIAGNOSTIC BILAT
5 series · 5 of 5 positions shown · non-contrast
Comparison: None.

CLINICAL DATA: 32-year-old presenting with a palpable lump in the
lower outer periareolar left breast which the patient noticed
approximately 2 weeks ago, described as firm and painless. Baseline
examination. No family history of breast cancer.

EXAM:
DIGITAL DIAGNOSTIC  BILATERAL MAMMOGRAM WITH CAD
ULTRASOUND LEFT BREAST

[R CC]
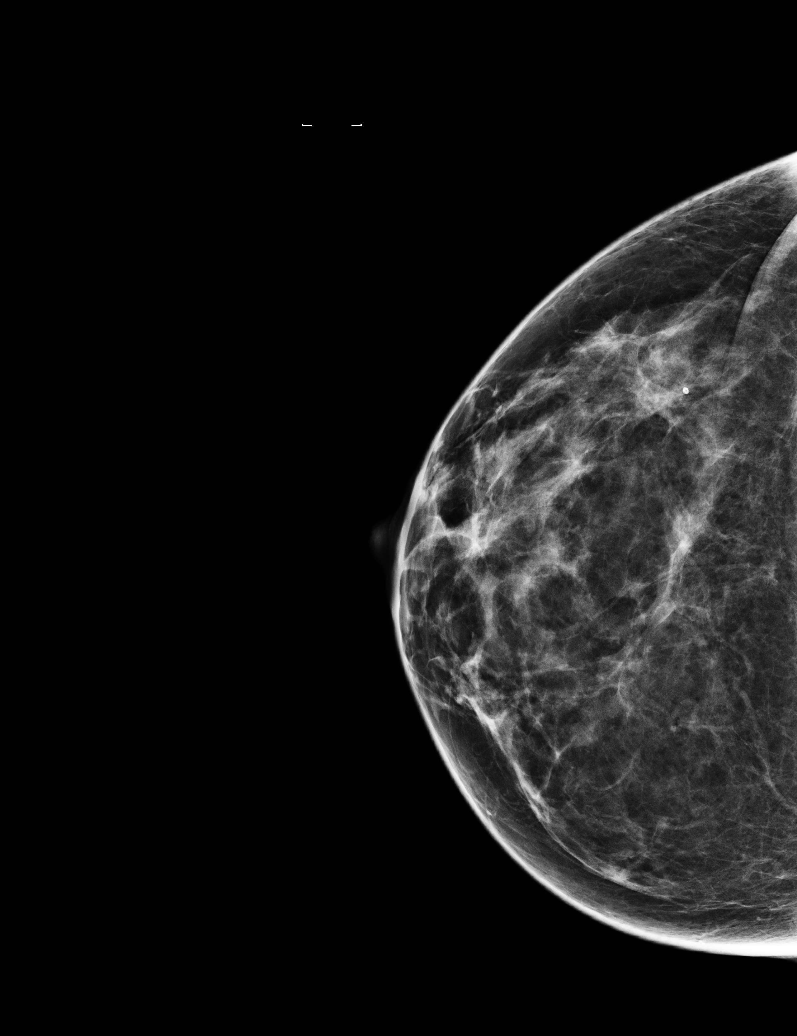

[L TAN]
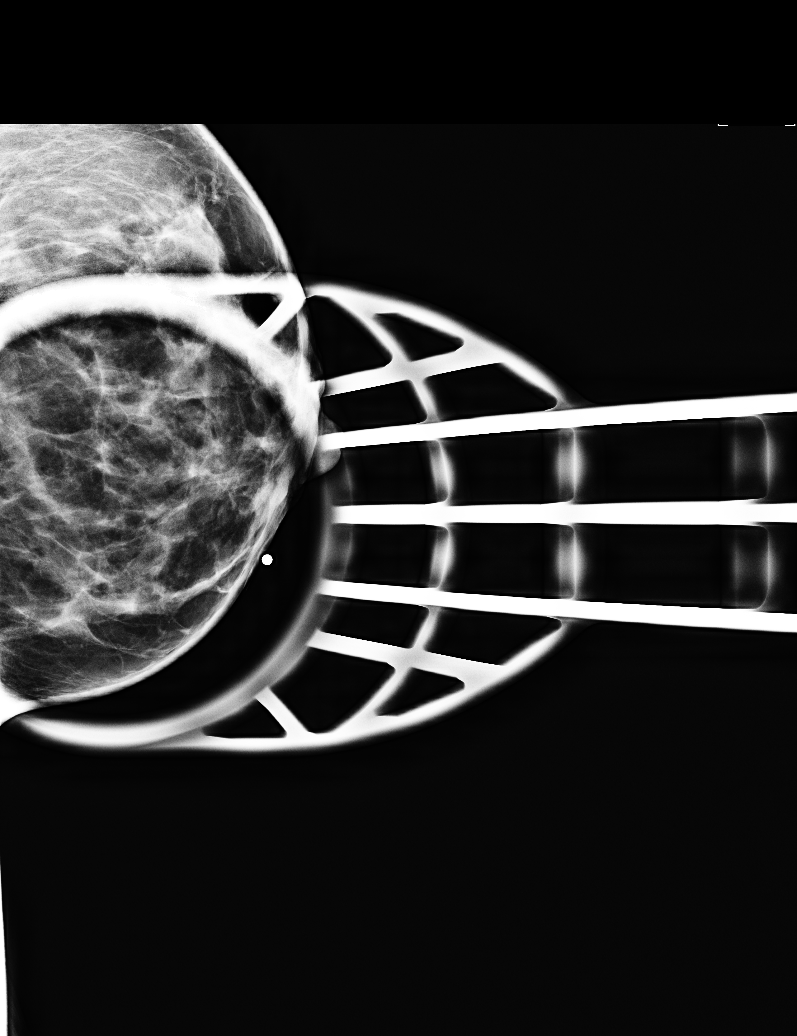

[R MLO]
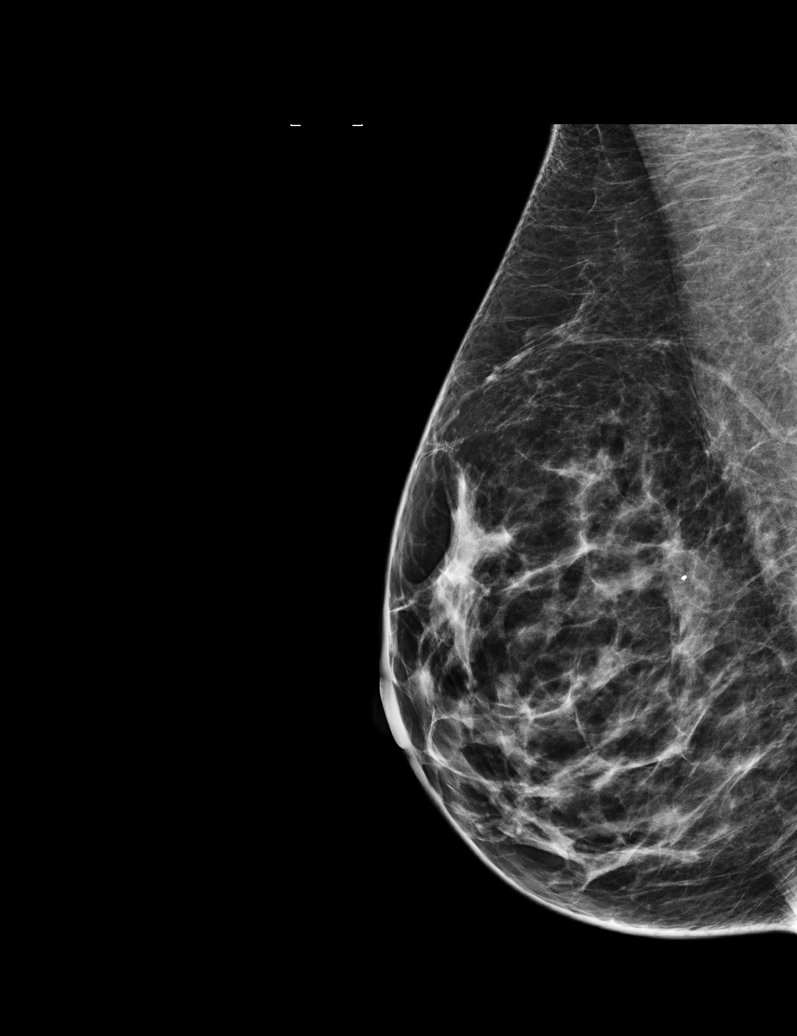

[L CC]
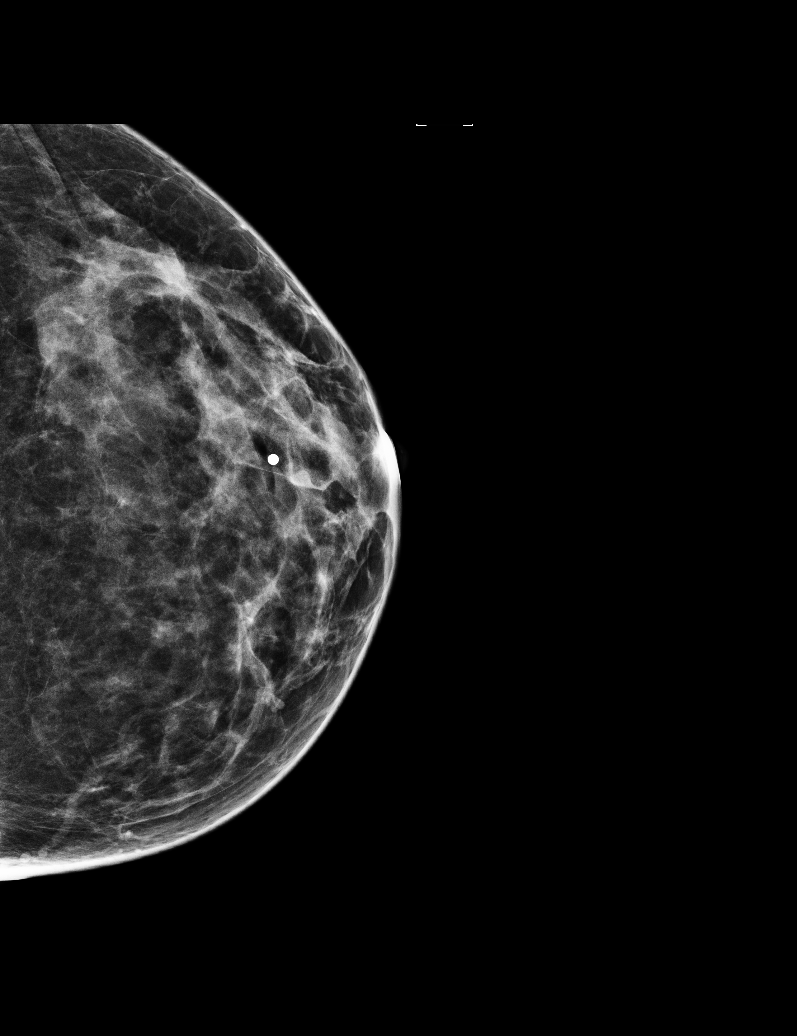

[L MLO]
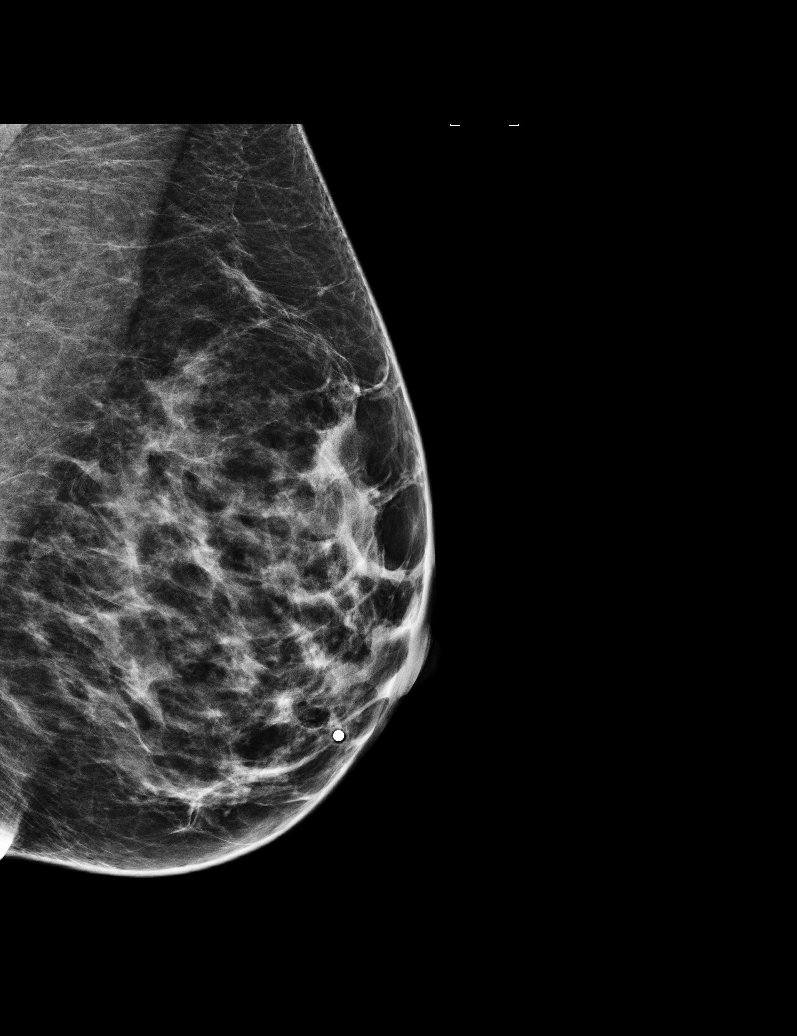

[5 of 5 positions shown; findings below may reference images not displayed]

ACR Breast Density Category c: The breast tissue is heterogeneously
dense, which may obscure small masses.
FINDINGS: CC and MLO views of both breasts and a spot tangential view of the
left breast in the area of palpable concern were obtained. No
findings suspicious for malignancy in either breast. Specifically,
no mammographic abnormality in the area of palpable concern in the
lower outer periareolar left breast.

Mammographic images were processed with CAD.

On physical exam, the tissues of the left breast have a
"lumpy-bumpy"" texture, but I do not palpate a discrete mass.

Ultrasound is performed, showing normal fatty and fibroglandular
tissue throughout the lower inner quadrant of the left breast. No
cyst, solid mass or abnormal acoustic shadowing was identified.
IMPRESSION: 1. No mammographic or sonographic evidence of malignancy, left
breast.
2. No mammographic evidence of malignancy, right breast.

RECOMMENDATION:
Screening mammogram at age 40 unless there are persistent or
intervening clinical concerns. (Code:I1-S-9PQ)

The importance of self breast examination was discussed with the
patient. I have discussed the findings and recommendations with the
patient. Results were also provided in writing at the conclusion of
the visit.

BI-RADS CATEGORY  1: Negative.

## 2016-01-18 ENCOUNTER — Inpatient Hospital Stay (HOSPITAL_COMMUNITY): Payer: BLUE CROSS/BLUE SHIELD | Admitting: Anesthesiology

## 2016-01-18 ENCOUNTER — Inpatient Hospital Stay (HOSPITAL_COMMUNITY)
Admission: AD | Admit: 2016-01-18 | Discharge: 2016-01-20 | DRG: 775 | Disposition: A | Discharge status: Home / Self Care | Payer: BLUE CROSS/BLUE SHIELD | Source: Ambulatory Visit | Attending: Obstetrics and Gynecology | Admitting: Obstetrics and Gynecology

## 2016-01-18 ENCOUNTER — Encounter (HOSPITAL_COMMUNITY): Payer: Self-pay | Admitting: *Deleted

## 2016-01-18 DIAGNOSIS — Z6832 Body mass index (BMI) 32.0-32.9, adult: Secondary | ICD-10-CM | POA: Diagnosis not present

## 2016-01-18 DIAGNOSIS — O99824 Streptococcus B carrier state complicating childbirth: Secondary | ICD-10-CM | POA: Diagnosis present

## 2016-01-18 DIAGNOSIS — Z3A39 39 weeks gestation of pregnancy: Secondary | ICD-10-CM | POA: Diagnosis not present

## 2016-01-18 DIAGNOSIS — E669 Obesity, unspecified: Secondary | ICD-10-CM | POA: Diagnosis present

## 2016-01-18 DIAGNOSIS — O99214 Obesity complicating childbirth: Secondary | ICD-10-CM | POA: Diagnosis present

## 2016-01-18 LAB — TYPE AND SCREEN
ABO/RH(D): A NEG
ANTIBODY SCREEN: NEGATIVE

## 2016-01-18 LAB — CBC
HEMATOCRIT: 36.3 % (ref 36.0–46.0)
HEMOGLOBIN: 12.1 g/dL (ref 12.0–15.0)
MCH: 29.3 pg (ref 26.0–34.0)
MCHC: 33.3 g/dL (ref 30.0–36.0)
MCV: 87.9 fL (ref 78.0–100.0)
Platelets: 170 10*3/uL (ref 150–400)
RBC: 4.13 MIL/uL (ref 3.87–5.11)
RDW: 13.6 % (ref 11.5–15.5)
WBC: 9.9 10*3/uL (ref 4.0–10.5)

## 2016-01-18 MED ORDER — EPHEDRINE 5 MG/ML INJ
10.0000 mg | INTRAVENOUS | Status: DC | PRN
  Filled 2016-01-18: qty 2

## 2016-01-18 MED ORDER — OXYCODONE-ACETAMINOPHEN 5-325 MG PO TABS: 1.0000 | ORAL_TABLET | ORAL | Status: DC | PRN

## 2016-01-18 MED ORDER — DIPHENHYDRAMINE HCL 50 MG/ML IJ SOLN: 12.5000 mg | INTRAMUSCULAR | Status: DC | PRN

## 2016-01-18 MED ORDER — PENICILLIN G POTASSIUM 5000000 UNITS IJ SOLR
2.5000 10*6.[IU] | INTRAMUSCULAR | Status: DC
  Administered 2016-01-18 (×2): 2.5 10*6.[IU] via INTRAVENOUS
  Filled 2016-01-18 (×9): qty 2.5

## 2016-01-18 MED ORDER — CITRIC ACID-SODIUM CITRATE 334-500 MG/5ML PO SOLN: 30.0000 mL | ORAL | Status: DC | PRN

## 2016-01-18 MED ORDER — FENTANYL 2.5 MCG/ML BUPIVACAINE 1/10 % EPIDURAL INFUSION (WH - ANES)
INTRAMUSCULAR | Status: AC
  Filled 2016-01-18: qty 125

## 2016-01-18 MED ORDER — LACTATED RINGERS IV SOLN
INTRAVENOUS | Status: DC
  Administered 2016-01-18 (×2): via INTRAVENOUS

## 2016-01-18 MED ORDER — FENTANYL 2.5 MCG/ML BUPIVACAINE 1/10 % EPIDURAL INFUSION (WH - ANES)
14.0000 mL/h | INTRAMUSCULAR | Status: DC | PRN
  Administered 2016-01-18 (×2): 14 mL/h via EPIDURAL

## 2016-01-18 MED ORDER — LIDOCAINE HCL (PF) 1 % IJ SOLN
30.0000 mL | INTRAMUSCULAR | Status: DC | PRN
  Administered 2016-01-18: 30 mL via SUBCUTANEOUS
  Filled 2016-01-18: qty 30

## 2016-01-18 MED ORDER — LACTATED RINGERS IV SOLN
2.5000 [IU]/h | INTRAVENOUS | Status: DC
  Filled 2016-01-18: qty 4

## 2016-01-18 MED ORDER — LIDOCAINE HCL (PF) 1 % IJ SOLN
INTRAMUSCULAR | Status: DC | PRN
  Administered 2016-01-18 (×2): 4 mL

## 2016-01-18 MED ORDER — PHENYLEPHRINE 40 MCG/ML (10ML) SYRINGE FOR IV PUSH (FOR BLOOD PRESSURE SUPPORT)
80.0000 ug | PREFILLED_SYRINGE | INTRAVENOUS | Status: DC | PRN
  Filled 2016-01-18: qty 2

## 2016-01-18 MED ORDER — PENICILLIN G POTASSIUM 5000000 UNITS IJ SOLR
5.0000 10*6.[IU] | Freq: Once | INTRAVENOUS | Status: AC
  Administered 2016-01-18: 5 10*6.[IU] via INTRAVENOUS
  Filled 2016-01-18: qty 5

## 2016-01-18 MED ORDER — OXYCODONE-ACETAMINOPHEN 5-325 MG PO TABS: 2.0000 | ORAL_TABLET | ORAL | Status: DC | PRN

## 2016-01-18 MED ORDER — PHENYLEPHRINE 40 MCG/ML (10ML) SYRINGE FOR IV PUSH (FOR BLOOD PRESSURE SUPPORT)
PREFILLED_SYRINGE | INTRAVENOUS | Status: AC
  Filled 2016-01-18: qty 20

## 2016-01-18 MED ORDER — LACTATED RINGERS IV SOLN
500.0000 mL | Freq: Once | INTRAVENOUS | Status: AC
  Administered 2016-01-18: 500 mL via INTRAVENOUS

## 2016-01-18 MED ORDER — TERBUTALINE SULFATE 1 MG/ML IJ SOLN
0.2500 mg | Freq: Once | INTRAMUSCULAR | Status: DC | PRN
  Filled 2016-01-18: qty 1

## 2016-01-18 MED ORDER — LACTATED RINGERS IV SOLN: 500.0000 mL | INTRAVENOUS | Status: DC | PRN

## 2016-01-18 MED ORDER — ACETAMINOPHEN 325 MG PO TABS: 650.0000 mg | ORAL_TABLET | ORAL | Status: DC | PRN

## 2016-01-18 MED ORDER — OXYTOCIN BOLUS FROM INFUSION
500.0000 mL | INTRAVENOUS | Status: DC
  Administered 2016-01-18: 500 mL via INTRAVENOUS

## 2016-01-18 MED ORDER — ONDANSETRON HCL 4 MG/2ML IJ SOLN: 4.0000 mg | Freq: Four times a day (QID) | INTRAMUSCULAR | Status: DC | PRN

## 2016-01-18 MED ORDER — OXYTOCIN 10 UNIT/ML IJ SOLN
1.0000 m[IU]/min | INTRAVENOUS | Status: DC
  Administered 2016-01-18: 2 m[IU]/min via INTRAVENOUS

## 2016-01-18 NOTE — Progress Notes (Signed)
FHTs 120s, gSTV, NST R Toco q 3-4 SVE 3-4/70/-2  AROM earlier was clear.

## 2016-01-18 NOTE — H&P (Signed)
35 y.o. 7231w4d  [id number] comes in for induction at term for AMA.  Otherwise has good fetal movement and no bleeding.  Past Medical History  Diagnosis Date  . Dyspnea     associated with #1  . Atrial tachycardia (HCC)     left atrial   History reviewed. No pertinent past surgical history.  OB History  Gravida Para Term Preterm AB SAB TAB Ectopic Multiple Living  5 2 2  1 1    2     # Outcome Date GA Lbr Len/2nd Weight Sex Delivery Anes PTL Lv  5 Current           4 Term 09/23/12 263w0d 02:22 / 02:36 4.06 kg (8 lb 15.2 oz) M Vag-Spont EPI  Y  3 SAB           2 Term     M  EPI N Y  1 [place]Gravida               Social History   Social History  . Marital Status: Married    Spouse Name: N/A  . Number of Children: N/A  . Years of Education: N/A   Occupational History  . Not on file.   Social History Main Topics  . Smoking status: Never Smoker   . Smokeless tobacco: Not on file  . Alcohol Use: No  . Drug Use: No  . Sexual Activity: Yes   Other Topics Concern  . Not on file   Social History Narrative   Review of patient's allergies indicates no known allergies.    Prenatal Transfer Tool  Maternal Diabetes: No Genetic Screening: Declined Maternal Ultrasounds/Referrals: Normal Fetal Ultrasounds or other Referrals:  None Maternal Substance Abuse:  No Significant Maternal Medications:  None Significant Maternal Lab Results: None  Other PNC: uncomplicated.    Filed Vitals:   01/18/16 1330 01/18/16 1400  BP: 88/66 127/61  Pulse: 84 71  Temp:  98.5 F (36.9 C)  Resp:  18     Lungs/Cor:  NAD Abdomen:  soft, gravid Ex:  no cords, erythema SVE:  1/50/-2 FHTs:  130s, good STV, NST R Toco:  q 5   A/P   AMA term induction.    GBS pos- PCN.  Zelina Jimerson A

## 2016-01-18 NOTE — Anesthesia Procedure Notes (Signed)
Epidural Patient location during procedure: OB  Staffing Anesthesiologist: Tylie Golonka Performed by: anesthesiologist   Preanesthetic Checklist Completed: patient identified, site marked, surgical consent, pre-op evaluation, timeout performed, IV checked, risks and benefits discussed and monitors and equipment checked  Epidural Patient position: sitting Prep: site prepped and draped and DuraPrep Patient monitoring: continuous pulse ox and blood pressure Approach: midline Location: L3-L4 Injection technique: LOR saline  Needle:  Needle type: Tuohy  Needle gauge: 17 G Needle length: 9 cm and 9 Needle insertion depth: 5 cm cm Catheter type: closed end flexible Catheter size: 19 Gauge Catheter at skin depth: 10 cm Test dose: negative  Assessment Events: blood not aspirated, injection not painful, no injection resistance, negative IV test and no paresthesia  Additional Notes Patient identified. Risks/Benefits/Options discussed with patient including but not limited to bleeding, infection, nerve damage, paralysis, failed block, incomplete pain control, headache, blood pressure changes, nausea, vomiting, reactions to medication both or allergic, itching and postpartum back pain. Confirmed with bedside nurse the patient's most recent platelet count. Confirmed with patient that they are not currently taking any anticoagulation, have any bleeding history or any family history of bleeding disorders. Patient expressed understanding and wished to proceed. All questions were answered. Sterile technique was used throughout the entire procedure. Please see nursing notes for vital signs. Test dose was given through epidural catheter and negative prior to continuing to dose epidural or start infusion. Warning signs of high block given to the patient including shortness of breath, tingling/numbness in hands, complete motor block, or any concerning symptoms with instructions to call for help. Patient was  given instructions on fall risk and not to get out of bed. All questions and concerns addressed with instructions to call with any issues or inadequate analgesia.      

## 2016-01-18 NOTE — Anesthesia Preprocedure Evaluation (Signed)

## 2016-01-19 LAB — CBC
HEMATOCRIT: 30.5 % — AB (ref 36.0–46.0)
Hemoglobin: 10.3 g/dL — ABNORMAL LOW (ref 12.0–15.0)
MCH: 29.3 pg (ref 26.0–34.0)
MCHC: 33.8 g/dL (ref 30.0–36.0)
MCV: 86.9 fL (ref 78.0–100.0)
PLATELETS: 143 10*3/uL — AB (ref 150–400)
RBC: 3.51 MIL/uL — AB (ref 3.87–5.11)
RDW: 13.6 % (ref 11.5–15.5)
WBC: 10.7 10*3/uL — ABNORMAL HIGH (ref 4.0–10.5)

## 2016-01-19 LAB — RPR: RPR Ser Ql: NONREACTIVE

## 2016-01-19 MED ORDER — BENZOCAINE-MENTHOL 20-0.5 % EX AERO
1.0000 "application " | INHALATION_SPRAY | CUTANEOUS | Status: DC | PRN
  Filled 2016-01-19: qty 56

## 2016-01-19 MED ORDER — SENNOSIDES-DOCUSATE SODIUM 8.6-50 MG PO TABS
2.0000 | ORAL_TABLET | ORAL | Status: DC
  Administered 2016-01-19 (×2): 2 via ORAL
  Filled 2016-01-19 (×2): qty 2

## 2016-01-19 MED ORDER — ACETAMINOPHEN 325 MG PO TABS: 650.0000 mg | ORAL_TABLET | ORAL | Status: DC | PRN

## 2016-01-19 MED ORDER — OXYCODONE-ACETAMINOPHEN 5-325 MG PO TABS: 2.0000 | ORAL_TABLET | ORAL | Status: DC | PRN

## 2016-01-19 MED ORDER — DIPHENHYDRAMINE HCL 25 MG PO CAPS: 25.0000 mg | ORAL_CAPSULE | Freq: Four times a day (QID) | ORAL | Status: DC | PRN

## 2016-01-19 MED ORDER — MAGNESIUM HYDROXIDE 400 MG/5ML PO SUSP: 30.0000 mL | ORAL | Status: DC | PRN

## 2016-01-19 MED ORDER — SIMETHICONE 80 MG PO CHEW: 80.0000 mg | CHEWABLE_TABLET | ORAL | Status: DC | PRN

## 2016-01-19 MED ORDER — METHYLERGONOVINE MALEATE 0.2 MG PO TABS: 0.2000 mg | ORAL_TABLET | ORAL | Status: DC | PRN

## 2016-01-19 MED ORDER — PRENATAL MULTIVITAMIN CH
1.0000 | ORAL_TABLET | Freq: Every day | ORAL | Status: DC
  Administered 2016-01-19: 1 via ORAL
  Filled 2016-01-19: qty 1

## 2016-01-19 MED ORDER — SODIUM CHLORIDE 0.9 % IV SOLN: 250.0000 mL | INTRAVENOUS | Status: DC | PRN

## 2016-01-19 MED ORDER — COCONUT OIL OIL: 1.0000 "application " | TOPICAL_OIL | Status: DC | PRN

## 2016-01-19 MED ORDER — ZOLPIDEM TARTRATE 5 MG PO TABS: 5.0000 mg | ORAL_TABLET | Freq: Every evening | ORAL | Status: DC | PRN

## 2016-01-19 MED ORDER — WITCH HAZEL-GLYCERIN EX PADS: 1.0000 "application " | MEDICATED_PAD | CUTANEOUS | Status: DC | PRN

## 2016-01-19 MED ORDER — OXYCODONE-ACETAMINOPHEN 5-325 MG PO TABS: 1.0000 | ORAL_TABLET | ORAL | Status: DC | PRN

## 2016-01-19 MED ORDER — DIBUCAINE 1 % RE OINT: 1.0000 "application " | TOPICAL_OINTMENT | RECTAL | Status: DC | PRN

## 2016-01-19 MED ORDER — SODIUM CHLORIDE 0.9% FLUSH: 3.0000 mL | Freq: Two times a day (BID) | INTRAVENOUS | Status: DC

## 2016-01-19 MED ORDER — SODIUM CHLORIDE 0.9% FLUSH: 3.0000 mL | INTRAVENOUS | Status: DC | PRN

## 2016-01-19 MED ORDER — FERROUS SULFATE 325 (65 FE) MG PO TABS
325.0000 mg | ORAL_TABLET | Freq: Two times a day (BID) | ORAL | Status: DC
  Administered 2016-01-19 – 2016-01-20 (×3): 325 mg via ORAL
  Filled 2016-01-19 (×3): qty 1

## 2016-01-19 MED ORDER — TETANUS-DIPHTH-ACELL PERTUSSIS 5-2.5-18.5 LF-MCG/0.5 IM SUSP: 0.5000 mL | Freq: Once | INTRAMUSCULAR | Status: DC

## 2016-01-19 MED ORDER — ONDANSETRON HCL 4 MG/2ML IJ SOLN: 4.0000 mg | INTRAMUSCULAR | Status: DC | PRN

## 2016-01-19 MED ORDER — ONDANSETRON HCL 4 MG PO TABS: 4.0000 mg | ORAL_TABLET | ORAL | Status: DC | PRN

## 2016-01-19 MED ORDER — MEASLES, MUMPS & RUBELLA VAC ~~LOC~~ INJ
0.5000 mL | INJECTION | Freq: Once | SUBCUTANEOUS | Status: DC
  Filled 2016-01-19: qty 0.5

## 2016-01-19 MED ORDER — METHYLERGONOVINE MALEATE 0.2 MG/ML IJ SOLN: 0.2000 mg | INTRAMUSCULAR | Status: DC | PRN

## 2016-01-19 MED ORDER — IBUPROFEN 800 MG PO TABS
800.0000 mg | ORAL_TABLET | Freq: Three times a day (TID) | ORAL | Status: DC
  Administered 2016-01-19 – 2016-01-20 (×3): 800 mg via ORAL
  Filled 2016-01-19 (×3): qty 1

## 2016-01-19 NOTE — Discharge Summary (Signed)
Obstetric Discharge Summary Reason for Admission: induction of labor Prenatal Procedures: none Intrapartum Procedures: spontaneous vaginal delivery Postpartum Procedures: none Complications-Operative and Postpartum: 2 degree perineal laceration HEMOGLOBIN  Date Value Ref Range Status  01/19/2016 10.3* 12.0 - 15.0 g/dL Final   HCT  Date Value Ref Range Status  01/19/2016 30.5* 36.0 - 46.0 % Final     Discharge Diagnoses: Term Pregnancy-delivered  Discharge Information: Date: 01/19/2016 Activity: pelvic rest Diet: routine Medications: Ibuprofen Condition: stable Instructions: refer to practice specific booklet Discharge to: home Follow-up Information    Follow up with Gemini Beaumier A, MD In 4 weeks.   Specialty:  Obstetrics and Gynecology   Contact information:   [address] [place] [id number] [phone]       Newborn Data: Live born female  Birth Weight: 7 lb 6.5 oz (3360 g) APGAR: 8, 9  Home with mother.  Liat Mayol A 01/19/2016, 7:54 AM

## 2016-01-19 NOTE — Anesthesia Postprocedure Evaluation (Signed)
Anesthesia Post Note  Patient: Vickie ChurchRebecca A Short  Procedure(s) Performed: * No procedures listed *  Patient location during evaluation: Mother Baby Anesthesia Type: Epidural Level of consciousness: awake and alert, oriented and patient cooperative Pain management: pain level controlled Vital Signs Assessment: post-procedure vital signs reviewed and stable Respiratory status: spontaneous breathing Cardiovascular status: stable Postop Assessment: no headache, epidural receding, patient able to bend at knees and no signs of nausea or vomiting Anesthetic complications: no    Last Vitals:  Filed Vitals:   01/19/16 0200 01/19/16 0605  BP: 110/55 113/56  Pulse: 63 60  Temp: 36.7 C 36.6 C  Resp: 18 18    Last Pain:  Filed Vitals:   01/19/16 0607  PainSc: 0-No pain                 [facility] HospitalWRINKLE,[place]quon Near

## 2016-01-20 MED ORDER — IBUPROFEN 800 MG PO TABS: 800.0000 mg | ORAL_TABLET | Freq: Four times a day (QID) | ORAL | Status: DC | PRN

## 2016-01-20 NOTE — Lactation Note (Addendum)
This note was copied from a baby's chart. Lactation Consultation Note  Patient Name: Vickie Short ZOXWR'UToday's Date: 01/20/2016 Reason for consult: Initial assessment;Other (Comment) (5% weight loss , Serum Bili at 30 - 8 )  Baby is 4434 hours old and has been consistent at the breast - Latch scores 7-10 's .  Voids and stools adequate for age. See doc flow sheets for details.  Baby latched when LC entered the room, with blanket wrap.  Deep latch noted with swallows and baby fed 25 mins and released on her own.  Mom is an experience breast feeder of 2 others with out problems.  Mom denies soreness, sore nipples and engorgement prevention and tx reviewed.  Mom already  has a hand pump and a DEBP at home. Per mom the #24 Flange is comfortable. Mother informed of post-discharge support and given phone number to the lactation department, including  services for phone call assistance; out-patient appointments; and breastfeeding support group. List of other  breastfeeding resources in the community given in the handout. Encouraged mother to call for problems or  concerns related to breastfeeding.  Maternal Data Has patient been taught Hand Expression?: Yes Does the patient have breastfeeding experience prior to this delivery?: Yes  Feeding Feeding Type:  (baby alaready latched , consistent swallowing pattern ) Length of feed:  (per  mom )  LATCH Score/Interventions Latch:  (latched with depth )  Audible Swallowing:  (swallows noted )     Comfort (Breast/Nipple):  (per mom comfortable )     Hold (Positioning):  (indpendent latch )     Lactation Tools Discussed/Used Tools: Pump Breast pump type: Manual WIC Program: No   Consult Status Consult Status: Complete    Kathrin Greathouseorio, [name] 01/20/2016, 9:27 AM

## 2016-01-20 NOTE — Progress Notes (Signed)
Patient is doing well.  She is ambulating, voiding, tolerating PO.  Pain control is good.  Lochia is appropriate  Filed Vitals:   01/19/16 0605 01/19/16 1430 01/19/16 1700 01/20/16 0606  BP: 113/56 125/59 109/46 112/58  Pulse: 60 69 62 65  Temp: 97.9 F (36.6 C) 98.2 F (36.8 C) 98 F (36.7 C) 97.9 F (36.6 C)  TempSrc: Oral Oral  Oral  Resp: 18 18 18 20   Height:      Weight:      SpO2:        NAD Fundus firm Ext: none   Lab Results  Component Value Date   WBC 10.7* 01/19/2016   HGB 10.3* 01/19/2016   HCT 30.5* 01/19/2016   MCV 86.9 01/19/2016   PLT 143* 01/19/2016    --/--/A NEG (04/21 1058)/RImmune  A/P 34 y.o. [id number] PPD#2 s/p TSVD. Routine care.   Baby Rh neg--rhogam not indicated Meeting all goals--d/c today.    Our Childrens HouseDYANNA GEFFEL [employer] Tim[employer] CompanyCLARK

## 2019-09-30 NOTE — L&D Delivery Note (Signed)
Patient was C/C/+2 and pushed for approx 5 minutes with epidural.  NSVD female infant, Apgars 7/9, weight pending.  Amniotic fluid was bloody, dark maroon blood noted, attempt at cord blood for pH unsuccessful. The patient had 1st degree laceration repaired with 3-0 vicryl. Fundus was firm. EBL was expected amount. Placenta was delivered intact. Vagina was clear.  Delayed cord clamping done for approx 20-30 seconds while warming baby, was then transferred to isolet for suctioning. Baby was then vigorous and doing skin to skin with mother.  Philip Aspen

## 2019-10-10 LAB — OB RESULTS CONSOLE GBS: GBS: POSITIVE

## 2019-10-17 LAB — OB RESULTS CONSOLE RUBELLA ANTIBODY, IGM: Rubella: IMMUNE

## 2019-10-17 LAB — OB RESULTS CONSOLE HIV ANTIBODY (ROUTINE TESTING): HIV: NONREACTIVE

## 2019-10-17 LAB — OB RESULTS CONSOLE RPR: RPR: NONREACTIVE

## 2019-10-17 LAB — OB RESULTS CONSOLE HEPATITIS B SURFACE ANTIGEN: Hepatitis B Surface Ag: NEGATIVE

## 2020-02-15 LAB — OB RESULTS CONSOLE RPR: RPR: NONREACTIVE

## 2020-04-24 ENCOUNTER — Encounter (HOSPITAL_COMMUNITY): Payer: Self-pay | Admitting: Obstetrics & Gynecology

## 2020-04-24 ENCOUNTER — Other Ambulatory Visit: Payer: Self-pay

## 2020-04-24 ENCOUNTER — Inpatient Hospital Stay (HOSPITAL_COMMUNITY)
Admission: AD | Admit: 2020-04-24 | Discharge: 2020-04-26 | DRG: 807 | Disposition: A | Discharge status: Home / Self Care | Payer: 59 | Attending: Obstetrics and Gynecology | Admitting: Obstetrics and Gynecology

## 2020-04-24 DIAGNOSIS — E669 Obesity, unspecified: Secondary | ICD-10-CM | POA: Diagnosis present

## 2020-04-24 DIAGNOSIS — Z3A37 37 weeks gestation of pregnancy: Secondary | ICD-10-CM

## 2020-04-24 DIAGNOSIS — O9942 Diseases of the circulatory system complicating childbirth: Secondary | ICD-10-CM | POA: Diagnosis present

## 2020-04-24 DIAGNOSIS — Z6791 Unspecified blood type, Rh negative: Secondary | ICD-10-CM

## 2020-04-24 DIAGNOSIS — O99214 Obesity complicating childbirth: Secondary | ICD-10-CM | POA: Diagnosis present

## 2020-04-24 DIAGNOSIS — O26893 Other specified pregnancy related conditions, third trimester: Secondary | ICD-10-CM | POA: Diagnosis present

## 2020-04-24 DIAGNOSIS — O99824 Streptococcus B carrier state complicating childbirth: Secondary | ICD-10-CM | POA: Diagnosis present

## 2020-04-24 DIAGNOSIS — O134 Gestational [pregnancy-induced] hypertension without significant proteinuria, complicating childbirth: Secondary | ICD-10-CM | POA: Diagnosis present

## 2020-04-24 DIAGNOSIS — O139 Gestational [pregnancy-induced] hypertension without significant proteinuria, unspecified trimester: Secondary | ICD-10-CM

## 2020-04-24 DIAGNOSIS — Z20822 Contact with and (suspected) exposure to covid-19: Secondary | ICD-10-CM | POA: Diagnosis present

## 2020-04-24 LAB — CBC
HCT: 34.2 % — ABNORMAL LOW (ref 36.0–46.0)
Hemoglobin: 11.1 g/dL — ABNORMAL LOW (ref 12.0–15.0)
MCH: 29.1 pg (ref 26.0–34.0)
MCHC: 32.5 g/dL (ref 30.0–36.0)
MCV: 89.8 fL (ref 80.0–100.0)
Platelets: 166 10*3/uL (ref 150–400)
RBC: 3.81 MIL/uL — ABNORMAL LOW (ref 3.87–5.11)
RDW: 12.8 % (ref 11.5–15.5)
WBC: 8.8 10*3/uL (ref 4.0–10.5)
nRBC: 0 % (ref 0.0–0.2)

## 2020-04-24 LAB — COMPREHENSIVE METABOLIC PANEL
ALT: 22 U/L (ref 0–44)
AST: 22 U/L (ref 15–41)
Albumin: 3 g/dL — ABNORMAL LOW (ref 3.5–5.0)
Alkaline Phosphatase: 112 U/L (ref 38–126)
Anion gap: 14 (ref 5–15)
BUN: 5 mg/dL — ABNORMAL LOW (ref 6–20)
CO2: 19 mmol/L — ABNORMAL LOW (ref 22–32)
Calcium: 9.2 mg/dL (ref 8.9–10.3)
Chloride: 104 mmol/L (ref 98–111)
Creatinine, Ser: 0.42 mg/dL — ABNORMAL LOW (ref 0.44–1.00)
GFR calc Af Amer: >60 mL/min (ref >60)
GFR calc non Af Amer: >60 mL/min (ref >60)
Glucose, Bld: 75 mg/dL (ref 70–99)
Potassium: 3.8 mmol/L (ref 3.5–5.1)
Sodium: 137 mmol/L (ref 135–145)
Total Bilirubin: 0.7 mg/dL (ref 0.3–1.2)
Total Protein: 6.2 g/dL — ABNORMAL LOW (ref 6.5–8.1)

## 2020-04-24 LAB — SARS CORONAVIRUS 2 BY RT PCR (HOSPITAL ORDER, PERFORMED IN ~~LOC~~ HOSPITAL LAB): SARS Coronavirus 2: NEGATIVE

## 2020-04-24 LAB — TYPE AND SCREEN
ABO/RH(D): A NEG
Antibody Screen: NEGATIVE

## 2020-04-24 MED ORDER — OXYCODONE-ACETAMINOPHEN 5-325 MG PO TABS: 2.0000 | ORAL_TABLET | ORAL | Status: DC | PRN

## 2020-04-24 MED ORDER — OXYCODONE-ACETAMINOPHEN 5-325 MG PO TABS: 1.0000 | ORAL_TABLET | ORAL | Status: DC | PRN

## 2020-04-24 MED ORDER — MISOPROSTOL 25 MCG QUARTER TABLET
ORAL_TABLET | ORAL | Status: AC
  Administered 2020-04-24: 25 ug via VAGINAL
  Filled 2020-04-24: qty 1

## 2020-04-24 MED ORDER — OXYTOCIN-SODIUM CHLORIDE 30-0.9 UT/500ML-% IV SOLN: 2.5000 [IU]/h | INTRAVENOUS | Status: DC

## 2020-04-24 MED ORDER — MISOPROSTOL 25 MCG QUARTER TABLET
25.0000 ug | ORAL_TABLET | ORAL | Status: DC | PRN
  Administered 2020-04-24: 25 ug via VAGINAL
  Filled 2020-04-24: qty 1

## 2020-04-24 MED ORDER — LIDOCAINE HCL (PF) 1 % IJ SOLN: 30.0000 mL | INTRAMUSCULAR | Status: DC | PRN

## 2020-04-24 MED ORDER — SOD CITRATE-CITRIC ACID 500-334 MG/5ML PO SOLN: 30.0000 mL | ORAL | Status: DC | PRN

## 2020-04-24 MED ORDER — OXYTOCIN BOLUS FROM INFUSION: 333.0000 mL | Freq: Once | INTRAVENOUS | Status: DC

## 2020-04-24 MED ORDER — PENICILLIN G POT IN DEXTROSE 60000 UNIT/ML IV SOLN
3.0000 10*6.[IU] | INTRAVENOUS | Status: DC
  Administered 2020-04-24 – 2020-04-25 (×3): 3 10*6.[IU] via INTRAVENOUS
  Filled 2020-04-24 (×3): qty 50

## 2020-04-24 MED ORDER — FENTANYL CITRATE (PF) 100 MCG/2ML IJ SOLN: 50.0000 ug | INTRAMUSCULAR | Status: DC | PRN

## 2020-04-24 MED ORDER — ONDANSETRON HCL 4 MG/2ML IJ SOLN: 4.0000 mg | Freq: Four times a day (QID) | INTRAMUSCULAR | Status: DC | PRN

## 2020-04-24 MED ORDER — SODIUM CHLORIDE 0.9 % IV SOLN
5.0000 10*6.[IU] | Freq: Once | INTRAVENOUS | Status: AC
  Administered 2020-04-24: 5 10*6.[IU] via INTRAVENOUS
  Filled 2020-04-24: qty 5

## 2020-04-24 MED ORDER — ACETAMINOPHEN 325 MG PO TABS: 650.0000 mg | ORAL_TABLET | ORAL | Status: DC | PRN

## 2020-04-24 MED ORDER — LACTATED RINGERS IV SOLN: INTRAVENOUS | Status: DC

## 2020-04-24 MED ORDER — LACTATED RINGERS IV SOLN: 500.0000 mL | INTRAVENOUS | Status: DC | PRN

## 2020-04-24 MED ORDER — OXYTOCIN-SODIUM CHLORIDE 30-0.9 UT/500ML-% IV SOLN
1.0000 m[IU]/min | INTRAVENOUS | Status: DC
  Administered 2020-04-25: 2 m[IU]/min via INTRAVENOUS
  Filled 2020-04-24: qty 500

## 2020-04-24 MED ORDER — TERBUTALINE SULFATE 1 MG/ML IJ SOLN: 0.2500 mg | Freq: Once | INTRAMUSCULAR | Status: DC | PRN

## 2020-04-24 NOTE — H&P (Addendum)
Vickie Short is a 39 y.o. female [phone] [redacted]w[redacted]d presenting for IOL for newly diagnosed GHTN. In clinic she reports no LOF, VB, contractions. Reports normal FM. Denies HA/VC/RUQ pain/SOB.  Pregnancy complicated by: 1. GHTN: diagnosed today in clinic by mild range BP > 4 hours apart 2. Abnormal fetal testing: NIPT HR triploidy/T13/T18 due to low FF @ 3.7%. FTS low risk. S/P MFM consult, declined CVS/amnio. Anatomy scan with MFM normal.  3. Mildly dilated ventricle at 29 weeks: 11.69mm.  4. AMA 5. Rh neg : pt declined rhogam due to husband AB Negative 6. GBS positive by urine culture  OB History    Gravida  6   Para  3   Term  3   Preterm      AB  1   Living  3     SAB  1   TAB      Ectopic      Multiple  0   Live Births  3          Past Medical History:  Diagnosis Date  . Atrial tachycardia (HCC)    left atrial  . Dyspnea    associated with #1   History reviewed. No pertinent surgical history. Family History: family history includes Atrial fibrillation in her father; Cancer in her maternal grandmother and paternal grandmother; Hypertension in her father. Social History:  reports that she has never smoked. She does not have any smokeless tobacco history on file. She reports that she does not drink alcohol and does not use drugs.     Maternal Diabetes: No Genetic Screening: Abnormal:  Results: Other:  NIPT HR triploidy/T13/T18 due to low FF @ 3.7%. FTS low risk. S/P MFM consult, declined CVS/amnio Maternal Ultrasounds/Referrals: Other: Anatomy normal, Mildly dilated ventricle at 29 weeks: 11.72mm.  Fetal Ultrasounds or other Referrals:  Referred to Materal Fetal Medicine due abnormal NIPT Maternal Substance Abuse:  No Significant Maternal Medications:  None Significant Maternal Lab Results:  Group B Strep positive Other Comments:  None  Review of Systems Per HPI Exam Physical Exam  Dilation: 1.5 Effacement (%): 50 Station: Ballotable Exam by:: Mary [place]  Johnson, RN  Blood pressure (!) 135/64, pulse 75, temperature 98.6 F (37 C), temperature source Oral, resp. rate 16, height 5\' 9"  (1.753 m), weight (!) 101.1 kg, unknown if currently breastfeeding.   Vitals:   04/24/20 1718 04/24/20 1735 04/24/20 1857  BP:  (!) 144/76 (!) 135/64  Pulse:  84 75  Resp:  16   Temp:  98.6 F (37 C)   TempSrc:  Oral   Weight: (!) 101.1 kg    Height: 5\' 9"  (1.753 m)      NAD, resting comfortably Gravid abdomen Fetal testing: FHR 160, Cat 1 Toco quiet Prenatal labs: ABO, Rh:  --/--/A NEG (07/27 1830) Antibody: NEG (07/27 1830) Rubella: Immune (01/18 0000) RPR: Nonreactive (05/19 0000)  HBsAg: Negative (01/18 0000)  HIV: Non-reactive (01/18 0000)  GBS: Positive/-- (01/11 0000)   Recent Labs    04/24/20 1830  WBC 8.8  HGB 11.1*  HCT 34.2*  PLT 166  NA 137  K 3.8  CL 104  BUN 5*  CREATININE 0.42*  AST 22  ALT 22  BILITOT 0.7   UPC pending  Assessment/Plan: Vickie Short 39 y.o. Vickie Short at [redacted]w[redacted]d here for IOL 2/2 GHTN 1. IOL: SVE 1.5/50/ballotable, plan for cytotec/pitocin/AROM prn 2. GHTN: CBC/CMP wnl, BP mild range, UPC pending, asymptomatic. Continue to monitor  3. Baby: - Abnormal  fetal testing: NIPT HR triploidy/T13/T18 due to low FF @ 3.7%. FTS low risk. S/P MFM consult, declined CVS/amnio. Anatomy scan with MFM normal.  - Mildly dilated ventricle at 29 weeks: 11.87mm.  Follow up with Peds 4. Rh neg : pt declined rhogam due to husband AB Negative 6. GBS positive by urine culture - PCN in labor  Vickie Short 04/24/2020, 8:16 PM

## 2020-04-25 ENCOUNTER — Encounter (HOSPITAL_COMMUNITY): Payer: Self-pay | Admitting: Obstetrics & Gynecology

## 2020-04-25 ENCOUNTER — Inpatient Hospital Stay (HOSPITAL_COMMUNITY): Payer: 59 | Admitting: Anesthesiology

## 2020-04-25 LAB — CBC
HCT: 32 % — ABNORMAL LOW (ref 36.0–46.0)
Hemoglobin: 10.6 g/dL — ABNORMAL LOW (ref 12.0–15.0)
MCH: 29.4 pg (ref 26.0–34.0)
MCHC: 33.1 g/dL (ref 30.0–36.0)
MCV: 88.6 fL (ref 80.0–100.0)
Platelets: 153 10*3/uL (ref 150–400)
RBC: 3.61 MIL/uL — ABNORMAL LOW (ref 3.87–5.11)
RDW: 12.8 % (ref 11.5–15.5)
WBC: 9.9 10*3/uL (ref 4.0–10.5)
nRBC: 0 % (ref 0.0–0.2)

## 2020-04-25 LAB — PROTEIN / CREATININE RATIO, URINE
Creatinine, Urine: 37.77 mg/dL
Total Protein, Urine: <6 mg/dL

## 2020-04-25 LAB — RPR: RPR Ser Ql: NONREACTIVE

## 2020-04-25 MED ORDER — PRENATAL MULTIVITAMIN CH
1.0000 | ORAL_TABLET | Freq: Every day | ORAL | Status: DC
  Administered 2020-04-26: 1 via ORAL
  Filled 2020-04-25: qty 1

## 2020-04-25 MED ORDER — DIBUCAINE (PERIANAL) 1 % EX OINT: 1.0000 "application " | TOPICAL_OINTMENT | CUTANEOUS | Status: DC | PRN

## 2020-04-25 MED ORDER — DIPHENHYDRAMINE HCL 50 MG/ML IJ SOLN: 12.5000 mg | INTRAMUSCULAR | Status: DC | PRN

## 2020-04-25 MED ORDER — SODIUM CHLORIDE (PF) 0.9 % IJ SOLN
INTRAMUSCULAR | Status: DC | PRN
  Administered 2020-04-25: 12 mL/h via EPIDURAL

## 2020-04-25 MED ORDER — SENNOSIDES-DOCUSATE SODIUM 8.6-50 MG PO TABS
2.0000 | ORAL_TABLET | ORAL | Status: DC
  Administered 2020-04-26: 2 via ORAL
  Filled 2020-04-25: qty 2

## 2020-04-25 MED ORDER — COCONUT OIL OIL: 1.0000 "application " | TOPICAL_OIL | Status: DC | PRN

## 2020-04-25 MED ORDER — WITCH HAZEL-GLYCERIN EX PADS
1.0000 "application " | MEDICATED_PAD | CUTANEOUS | Status: DC | PRN
  Administered 2020-04-25: 1 via TOPICAL

## 2020-04-25 MED ORDER — OXYCODONE HCL 5 MG PO TABS: 10.0000 mg | ORAL_TABLET | ORAL | Status: DC | PRN

## 2020-04-25 MED ORDER — LACTATED RINGERS IV SOLN: 500.0000 mL | Freq: Once | INTRAVENOUS | Status: DC

## 2020-04-25 MED ORDER — OXYCODONE HCL 5 MG PO TABS: 5.0000 mg | ORAL_TABLET | ORAL | Status: DC | PRN

## 2020-04-25 MED ORDER — SIMETHICONE 80 MG PO CHEW: 80.0000 mg | CHEWABLE_TABLET | ORAL | Status: DC | PRN

## 2020-04-25 MED ORDER — PHENYLEPHRINE 40 MCG/ML (10ML) SYRINGE FOR IV PUSH (FOR BLOOD PRESSURE SUPPORT): 80.0000 ug | PREFILLED_SYRINGE | INTRAVENOUS | Status: DC | PRN

## 2020-04-25 MED ORDER — DIPHENHYDRAMINE HCL 25 MG PO CAPS: 25.0000 mg | ORAL_CAPSULE | Freq: Four times a day (QID) | ORAL | Status: DC | PRN

## 2020-04-25 MED ORDER — ONDANSETRON HCL 4 MG PO TABS: 4.0000 mg | ORAL_TABLET | ORAL | Status: DC | PRN

## 2020-04-25 MED ORDER — FENTANYL-BUPIVACAINE-NACL 0.5-0.125-0.9 MG/250ML-% EP SOLN
12.0000 mL/h | EPIDURAL | Status: DC | PRN
  Filled 2020-04-25: qty 250

## 2020-04-25 MED ORDER — IBUPROFEN 600 MG PO TABS
600.0000 mg | ORAL_TABLET | Freq: Four times a day (QID) | ORAL | Status: DC
  Administered 2020-04-25 – 2020-04-26 (×4): 600 mg via ORAL
  Filled 2020-04-25 (×4): qty 1

## 2020-04-25 MED ORDER — ZOLPIDEM TARTRATE 5 MG PO TABS: 5.0000 mg | ORAL_TABLET | Freq: Every evening | ORAL | Status: DC | PRN

## 2020-04-25 MED ORDER — TETANUS-DIPHTH-ACELL PERTUSSIS 5-2.5-18.5 LF-MCG/0.5 IM SUSP: 0.5000 mL | Freq: Once | INTRAMUSCULAR | Status: DC

## 2020-04-25 MED ORDER — BENZOCAINE-MENTHOL 20-0.5 % EX AERO
1.0000 "application " | INHALATION_SPRAY | CUTANEOUS | Status: DC | PRN
  Administered 2020-04-25: 1 via TOPICAL
  Filled 2020-04-25: qty 56

## 2020-04-25 MED ORDER — EPHEDRINE 5 MG/ML INJ: 10.0000 mg | INTRAVENOUS | Status: DC | PRN

## 2020-04-25 MED ORDER — LIDOCAINE HCL (PF) 1 % IJ SOLN
INTRAMUSCULAR | Status: DC | PRN
  Administered 2020-04-25: 2 mL via EPIDURAL
  Administered 2020-04-25: 10 mL via EPIDURAL

## 2020-04-25 MED ORDER — ONDANSETRON HCL 4 MG/2ML IJ SOLN: 4.0000 mg | INTRAMUSCULAR | Status: DC | PRN

## 2020-04-25 MED ORDER — ACETAMINOPHEN 325 MG PO TABS: 650.0000 mg | ORAL_TABLET | ORAL | Status: DC | PRN

## 2020-04-25 NOTE — Progress Notes (Signed)
OBGYN Note S: comfortable with epidural O:  Vitals:   04/25/20 0600 04/25/20 0630  BP: (!) 122/56 (!) 114/57  Pulse: 71 68  Resp: 18 17  Temp:     SVE 3/50/-2, AROM scant clear fluid FHR 120, Cat 1. Toco q2m Vickie Short 38 y.o. [id number] at [redacted]w[redacted]d 1. IOL: cont pitocin, anticipate SVD 2. GHTN: normotensive-mild range BP -UPC pending [name] 04/25/20 6:43 AM

## 2020-04-25 NOTE — Lactation Note (Signed)
This note was copied from a baby's chart. Lactation Consultation Note  Patient Name: Vickie Short Today's Date: 04/25/2020  P1, 11 hour ETI infant LC entered the room, Per dad mom in shower prefer LC services to return later.     Maternal Data    Feeding Feeding Type: Breast Fed  LATCH Score                   Interventions    Lactation Tools Discussed/Used     Consult Status      Vickie Short 04/25/2020, 10:40 PM

## 2020-04-25 NOTE — Anesthesia Preprocedure Evaluation (Signed)
Anesthesia Evaluation  Patient identified by MRN, date of birth, ID band Patient awake    Reviewed: Allergy & Precautions, Patient's Chart, lab work & pertinent test results  Airway Mallampati: II  TM Distance: >3 FB Neck ROM: Full    Dental no notable dental hx.    Pulmonary neg pulmonary ROS,    Pulmonary exam normal breath sounds clear to auscultation       Cardiovascular hypertension, Normal cardiovascular exam Rhythm:Regular Rate:Normal  Gestational HTN  Hx atrial tachycardia and SOB    Neuro/Psych negative neurological ROS  negative psych ROS   GI/Hepatic negative GI ROS, Neg liver ROS,   Endo/Other  Obesity BMI 33  Renal/GU negative Renal ROS  negative genitourinary   Musculoskeletal negative musculoskeletal ROS (+)   Abdominal   Peds negative pediatric ROS (+)  Hematology  (+) Blood dyscrasia, anemia , hct 32, plt 153   Anesthesia Other Findings   Reproductive/Obstetrics (+) Pregnancy G5P3                             Anesthesia Physical Anesthesia Plan  ASA: III and emergent  Anesthesia Plan: Epidural   Post-op Pain Management:    Induction:   PONV Risk Score and Plan: 2  Airway Management Planned: Natural Airway  Additional Equipment: None  Intra-op Plan:   Post-operative Plan:   Informed Consent: I have reviewed the patients History and Physical, chart, labs and discussed the procedure including the risks, benefits and alternatives for the proposed anesthesia with the patient or authorized representative who has indicated his/her understanding and acceptance.       Plan Discussed with:   Anesthesia Plan Comments:         Anesthesia Quick Evaluation

## 2020-04-25 NOTE — Anesthesia Postprocedure Evaluation (Signed)
Anesthesia Post Note  Patient: SILA SARSFIELD  Procedure(s) Performed: AN AD HOC LABOR EPIDURAL     Patient location during evaluation: Mother Baby Anesthesia Type: Epidural Level of consciousness: awake and alert and oriented Pain management: satisfactory to patient Vital Signs Assessment: post-procedure vital signs reviewed and stable Respiratory status: respiratory function stable Cardiovascular status: stable Postop Assessment: no headache, no backache, epidural receding, patient able to bend at knees, no signs of nausea or vomiting and adequate PO intake Anesthetic complications: no Comments: The patient hasn't been able to ambulate yet as she just got feeling back in her left leg. The patient states that her left side was more numb than her right side the entire time but over- all she was comfortable with the epidural.   No complications documented.  Last Vitals:  Vitals:   04/25/20 1445 04/25/20 1610  BP: 112/66 (!) 120/57  Pulse: 66 67  Resp: 18 18  Temp: 36.7 C 36.8 C  SpO2:      Last Pain:  Vitals:   04/25/20 1610  TempSrc: Oral  PainSc:    Pain Goal: Patients Stated Pain Goal: 7 (04/24/20 2030)                 Karleen Dolphin

## 2020-04-25 NOTE — Anesthesia Procedure Notes (Signed)
Epidural Patient location during procedure: OB Start time: 04/25/2020 4:51 AM End time: 04/25/2020 5:04 AM  Staffing Anesthesiologist: Lannie Fields, DO Performed: anesthesiologist   Preanesthetic Checklist Completed: patient identified, IV checked, risks and benefits discussed, monitors and equipment checked, pre-op evaluation and timeout performed  Epidural Patient position: sitting Prep: DuraPrep and site prepped and draped Patient monitoring: continuous pulse ox, blood pressure, heart rate and cardiac monitor Approach: midline Location: L3-L4 Injection technique: LOR air  Needle:  Needle type: Tuohy  Needle gauge: 17 G Needle length: 9 cm Needle insertion depth: 5.5 cm Catheter type: closed end flexible Catheter size: 19 Gauge Catheter at skin depth: 11 cm Test dose: negative  Assessment Sensory level: T8 Events: blood not aspirated, injection not painful, no injection resistance, no paresthesia and negative IV test  Additional Notes Patient identified. Risks/Benefits/Options discussed with patient including but not limited to bleeding, infection, nerve damage, paralysis, failed block, incomplete pain control, headache, blood pressure changes, nausea, vomiting, reactions to medication both or allergic, itching and postpartum back pain. Confirmed with bedside nurse the patient's most recent platelet count. Confirmed with patient that they are not currently taking any anticoagulation, have any bleeding history or any family history of bleeding disorders. Patient expressed understanding and wished to proceed. All questions were answered. Sterile technique was used throughout the entire procedure. Please see nursing notes for vital signs. Test dose was given through epidural catheter and negative prior to continuing to dose epidural or start infusion. Warning signs of high block given to the patient including shortness of breath, tingling/numbness in hands, complete motor  block, or any concerning symptoms with instructions to call for help. Patient was given instructions on fall risk and not to get out of bed. All questions and concerns addressed with instructions to call with any issues or inadequate analgesia.  Reason for block:procedure for pain

## 2020-04-25 NOTE — Progress Notes (Signed)
Patient is comfortable with epidural, no complaints. On review of strip, Cat 1 noted around 9:30am, at time of cervical check approx 10:30 by RN, variable and late decelerations noted with normal baseline and good variability.  Discussed with RN and reviewed strip together, RN states decels began with position change and pt sitting up, will readjust and add peanut ball. Advised to alert me if continued decelerations.  SVE at prior exam 5/70/-2 A/P: IOL for GHTN at term Will consider d/c of pitocin with persistent decelerations Cont. Current care with pitocin augmentation while make position adjustments.

## 2020-04-25 NOTE — Lactation Note (Signed)
This note was copied from a baby's chart. Lactation Consultation Note  Patient Name: Vickie Short [id number] Date: 04/25/2020 Reason for consult: Initial assessment;Early term 37-38.6wks P1, 11 hour ETI female infant. Mom hx: GHTN, AMA, depression and anxiety.  Per mom, she has DEBP at home. Mom is experienced at BF,she BF her other 3 children for one year each, her 3rd child is currently 41 years old. Mom has DEBP at home. Per mom, infant had 2 voids and one stool. Per mom, she felt latch could have been better. Mom latched infant on her right breast using the cross cradle hold, infant latched wide mouth, nose and chin touching breast with top lip flanged outward, and still BF after 7 minutes when LC left room. Per mom, she can tell a difference with latch, she not feeling pain only a tug.  Mom knows to BF according to cues, 8 to 12+ times within 24 hours and on demand. Mom will continue to do STS with infant as much as possible. Reviewed Baby & Me book's Breastfeeding Basics.  Mom made aware of O/P services, breastfeeding support groups, community resources, and our phone # for post-discharge questions.   Maternal Data Formula Feeding for Exclusion: No Has patient been taught Hand Expression?: Yes Does the patient have breastfeeding experience prior to this delivery?: Yes  Feeding Feeding Type: Breast Fed  LATCH Score Latch: Grasps breast easily, tongue down, lips flanged, rhythmical sucking.  Audible Swallowing: Spontaneous and intermittent  Type of Nipple: Everted at rest and after stimulation  Comfort (Breast/Nipple): Soft / non-tender  Hold (Positioning): Assistance needed to correctly position infant at breast and maintain latch.  LATCH Score: 9  Interventions Interventions: Breast feeding basics reviewed;Assisted with latch;Skin to skin;Breast massage;Hand express;Breast compression;Adjust position;Support pillows;Position options;Expressed milk  Lactation Tools  Discussed/Used WIC Program: No   Consult Status Consult Status: Follow-up Date: 04/26/20 Follow-up type: In-patient    Danelle Earthly 04/25/2020, 11:44 PM

## 2020-04-26 LAB — CBC
HCT: 28.1 % — ABNORMAL LOW (ref 36.0–46.0)
Hemoglobin: 9.5 g/dL — ABNORMAL LOW (ref 12.0–15.0)
MCH: 30.4 pg (ref 26.0–34.0)
MCHC: 33.8 g/dL (ref 30.0–36.0)
MCV: 89.8 fL (ref 80.0–100.0)
Platelets: 134 10*3/uL — ABNORMAL LOW (ref 150–400)
RBC: 3.13 MIL/uL — ABNORMAL LOW (ref 3.87–5.11)
RDW: 12.9 % (ref 11.5–15.5)
WBC: 8.1 10*3/uL (ref 4.0–10.5)
nRBC: 0 % (ref 0.0–0.2)

## 2020-04-26 MED ORDER — IBUPROFEN 600 MG PO TABS: 600.0000 mg | ORAL_TABLET | Freq: Four times a day (QID) | ORAL | 0 refills | Status: DC

## 2020-04-26 NOTE — Discharge Summary (Signed)
     Postpartum Discharge Summary      Patient Name: Vickie Short DOB: 16-Dec-1980 MRN: [id number]  Date of admission: 04/24/2020 Delivery date:04/25/2020  Delivering provider: Philip Aspen  Date of discharge: 04/26/2020  Admitting diagnosis: Gestational hypertension [O13.9] and abnormal NIPT and dilated cerebral ventricle Intrauterine pregnancy: [redacted]w[redacted]d          Discharge diagnosis:    Gestational hypertension [O13.9] and abnormal NIPT and dilated cerebral ventricle                                           Hospital course: Induction of Labor With Vaginal Delivery   39 y.o. yo [id number] at [redacted]w[redacted]d was admitted to the hospital 04/24/2020 for induction of labor.  Indication for induction: Gestational hypertension.  Patient had an uncomplicated labor course as follows: Membrane Rupture Time/Date: 6:36 AM ,04/25/2020   Delivery Method:Vaginal, Spontaneous  Episiotomy: None  Lacerations:  1st degree;Perineal  Details of delivery can be found in separate delivery note.  Patient had a routine postpartum course. Patient is discharged home 04/26/20.  Newborn Data: Birth date:04/25/2020  Birth time:11:40 AM  Gender:Female  Living status:Living  Apgars:7 ,9  Weight:3415 g   Physical exam  Vitals:   04/25/20 1610 04/25/20 1954 04/26/20 0030 04/26/20 0405  BP: (!) 120/57 116/73 (!) 117/52 (!) 113/56  Pulse: 67 62 68 61  Resp: 18 20 20 18   Temp: 98.2 F (36.8 C) 97.9 F (36.6 C) 97.6 F (36.4 C) 97.6 F (36.4 C)  TempSrc: Oral Oral Oral Oral  SpO2:  100% 99% 100%  Weight:      Height:       General: alert Lochia: appropriate Labs: Lab Results  Component Value Date   WBC 8.1 04/26/2020   HGB 9.5 (L) 04/26/2020   HCT 28.1 (L) 04/26/2020   MCV 89.8 04/26/2020   PLT 134 (L) 04/26/2020   CMP Latest Ref Rng & Units 04/24/2020  Glucose 70 - 99 mg/dL 75  BUN 6 - 20 mg/dL 5(L)  Creatinine 04/26/2020 - 1.00 mg/dL 4.70)  Sodium 9.62(E - 366 mmol/L 137  Potassium 3.5 - 5.1 mmol/L 3.8   Chloride 98 - 111 mmol/L 104  CO2 22 - 32 mmol/L 19(L)  Calcium 8.9 - 10.3 mg/dL 9.2  Total Protein 6.5 - 8.1 g/dL 6.2(L)  Total Bilirubin 0.3 - 1.2 mg/dL 0.7  Alkaline Phos 38 - 126 U/L 112  AST 15 - 41 U/L 22  ALT 0 - 44 U/L 22       Discharge home in stable condition Infant Feeding: Breast Infant Disposition:home with mother Discharge instruction: per After Visit Summary and Postpartum booklet. Activity: Advance as tolerated. Pelvic rest for 6 weeks.  Diet: routine diet Follow up Visit:  Follow-up Information    294, DO. Schedule an appointment as soon as possible for a visit in 1 month(s).   Specialty: Obstetrics and Gynecology Contact information: [address] [place] [phone]                   04/26/2020 04/28/2020, MD

## 2020-04-26 NOTE — Lactation Note (Signed)
This note was copied from a baby's chart. Lactation Consultation Note  Patient Name: Vickie Short HERDE'Y Date: 04/26/2020 Reason for consult: Follow-up assessment;Early term 37-38.6wks  LC in to visit with P4 Mom of ET infant on day of possible early discharge. Baby at 9 hrs old.  Mom reports baby latches and breastfeeds well.  Baby at 3% weight loss.   Baby has breastfed 9 times so far and output 2 voids and 2 stools so far.   Baby circumcised this am and sleeping in crib.   Engorgement prevention and treatment reviewed.    Talked about watching for sleepiness due to baby being ET.  Encouraged keeping baby STS and offering breast with cues.  If baby sleepy encouraged parents to awaken baby, change diaper and place STS.   Mom aware of OP lactation support available to her.  Encouraged her to call prn.  Pediatrician has an Printmaker on staff that Mom will have as resource.   Interventions Interventions: Breast feeding basics reviewed;Skin to skin;Breast massage;Hand express   Consult Status Consult Status: Complete Date: 04/26/20 Follow-up type: Call as needed    [name] 04/26/2020, 11:33 AM

## 2020-04-26 NOTE — Progress Notes (Signed)
[name] was referred for history of depression/anxiety. * Referral screened out by Clinical Social Worker because none of the following criteria appear to apply: ~ History of anxiety/depression during this pregnancy, or of post-partum depression following prior delivery. ~ Diagnosis of anxiety and/or depression within last 3 years. Per further chart review, it appears that [name] was diagnosed with anxiety in 2004 and depression in 2003.  OR * [name]'s symptoms currently being treated with medication and/or therapy.    Please contact the Clinical Social Worker if needs arise, by [name] request, or if [name] scores greater than 9/yes to question 10 on Edinburgh Postpartum Depression Screen.    Vickie Short, MSW, LCSW [facility] [phone]    

## 2020-04-26 NOTE — Progress Notes (Addendum)
PPD#1 Pt states that she is doing well. She would like to go home. She has nl lochia. No headache or vision changes. VSSAF HGB-9.5 platlets 134K IMP/ Doing well  Plan/ Will discharge to home

## 2020-04-27 ENCOUNTER — Ambulatory Visit: Payer: Self-pay

## 2020-04-27 LAB — SURGICAL PATHOLOGY

## 2020-04-27 NOTE — Lactation Note (Signed)
This note was copied from a baby's chart. Infant is 2 hours old early term. Infant has a 9% weight loss and a bili at 10.6 mg/dl. Infant was placed in cross cradle at the breast nursing intermittently for about 10- 15 minutes. Infant pushing off the breast with his tongue. Assisted Mom to get him into a deeper latch with a U hold while massaging the breast. Infant noted to have a bubble palate and the tongue does not elevate with a cry. Nipple pinched once the baby is off the breast. Chin tug taught to the father in order to widen the latch.   LC showed Mom how to hand express. Infant given a few drops of colostrum with a spoon. Set Mom up with a DEBP with a 24 flange and assisted her on starting the pump on the initiation setting. Mom expressed 5 ml total of colostrum and RN instructed her on spoon feeding. Shared our concerns that due to a difficult latch, baby will need to be supplemented. Donor BM was offered, Mom declined. Mom instructed to make sure infant gets on with a wide mouth and to pull down on the chin to increase the angle on the breast. Infant tries to do a shallow latch otherwise.   Plan:  1.Awaken baby as needed, feeding at the breast based on cues with a deep latch and skin to skin 8-12 feedings in a 24 hour period.  2. Mom to pump on initiation setting with a double pump after feedings, for at least 15 minutes.  3. Mom will supplement after nursing, with EBM via  Spoon, or paced bottle feeding.  4. Ask for help prn  RN updated on the care plan.     Lactation Consultation Note LATCH Score: 7  Interventions Interventions: DEBP  Lactation Tools Discussed/Used Tools: Pump Breast pump type: Double-Electric Breast Pump WIC Program: No Pump Review: Setup, frequency, and cleaning;Milk Storage Initiated by:: Erby Pian RN IBCLC Date initiated:: 04/27/20   Consult Status Consult Status: Follow-up Date: 04/27/20 Follow-up type: In-patient    Tacora Athanas   Nicholson-Springer 04/27/2020, 9:27 AM

## 2020-04-27 NOTE — Lactation Note (Signed)
This note was copied from a baby's chart. Lactation Consultation Note  Patient Name: Vickie Short UYEBX'[id number] Date: 04/27/2020 Reason for consult: Difficult latch;Nipple pain/trauma;Follow-up assessment   Maternal Data    Feeding Feeding Type: Breast Milk  LATCH Score Latch: Grasps breast easily, tongue down, lips flanged, rhythmical sucking.  Audible Swallowing: A few with stimulation  Type of Nipple: Everted at rest and after stimulation  Comfort (Breast/Nipple): Filling, red/small blisters or bruises, mild/mod discomfort  Hold (Positioning): Assistance needed to correctly position infant at breast and maintain latch.  LATCH Score: 7  Interventions Interventions: DEBP  Lactation Tools Discussed/Used Tools: Pump Breast pump type: Double-Electric Breast Pump WIC Program: No Pump Review: Setup, frequency, and cleaning;Milk Storage Initiated by:: Erby Pian RN IBCLC Date initiated:: 04/27/20   Consult Status Consult Status: Follow-up Date: 04/27/20 Follow-up type: In-patient    Dinara Lupu  Nicholson-Springer 04/27/2020, 10:47 AM

## 2021-05-20 ENCOUNTER — Other Ambulatory Visit: Payer: Self-pay | Admitting: Obstetrics and Gynecology

## 2021-05-20 DIAGNOSIS — R928 Other abnormal and inconclusive findings on diagnostic imaging of breast: Secondary | ICD-10-CM

## 2021-05-31 ENCOUNTER — Other Ambulatory Visit: Payer: Self-pay | Admitting: Obstetrics and Gynecology

## 2021-05-31 ENCOUNTER — Other Ambulatory Visit: Payer: Self-pay

## 2021-05-31 ENCOUNTER — Ambulatory Visit
Admission: RE | Admit: 2021-05-31 | Discharge: 2021-05-31 | Disposition: A | Discharge status: Home / Self Care | Payer: 59 | Source: Ambulatory Visit | Attending: Obstetrics and Gynecology | Admitting: Obstetrics and Gynecology

## 2021-05-31 DIAGNOSIS — R928 Other abnormal and inconclusive findings on diagnostic imaging of breast: Secondary | ICD-10-CM

## 2021-05-31 DIAGNOSIS — N6310 Unspecified lump in the right breast, unspecified quadrant: Secondary | ICD-10-CM

## 2021-12-03 ENCOUNTER — Other Ambulatory Visit: Payer: Self-pay | Admitting: Obstetrics and Gynecology

## 2021-12-03 ENCOUNTER — Ambulatory Visit
Admission: RE | Admit: 2021-12-03 | Discharge: 2021-12-03 | Disposition: A | Discharge status: Home / Self Care | Payer: Managed Care, Other (non HMO) | Source: Ambulatory Visit | Attending: Obstetrics and Gynecology | Admitting: Obstetrics and Gynecology

## 2021-12-03 DIAGNOSIS — N6310 Unspecified lump in the right breast, unspecified quadrant: Secondary | ICD-10-CM

## 2021-12-09 ENCOUNTER — Ambulatory Visit (INDEPENDENT_AMBULATORY_CARE_PROVIDER_SITE_OTHER): Payer: Managed Care, Other (non HMO)

## 2021-12-09 ENCOUNTER — Ambulatory Visit (INDEPENDENT_AMBULATORY_CARE_PROVIDER_SITE_OTHER): Payer: Managed Care, Other (non HMO) | Admitting: Internal Medicine

## 2021-12-09 ENCOUNTER — Other Ambulatory Visit: Payer: Self-pay

## 2021-12-09 ENCOUNTER — Encounter: Payer: Self-pay | Admitting: Internal Medicine

## 2021-12-09 VITALS — BP 126/70 | HR 93 | Ht 69.0 in | Wt 179.8 lb

## 2021-12-09 DIAGNOSIS — R002 Palpitations: Secondary | ICD-10-CM | POA: Diagnosis not present

## 2021-12-09 DIAGNOSIS — I471 Supraventricular tachycardia: Secondary | ICD-10-CM

## 2021-12-09 NOTE — Patient Instructions (Addendum)
Medication Instructions:  ?Your physician recommends that you continue on your current medications as directed. Please refer to the Current Medication list given to you today. ? ?*If you need a refill on your cardiac medications before your next appointment, please call your pharmacy* ? ? ?Lab Work: ?None ordered. ? ?If you have labs (blood work) drawn today and your tests are completely normal, you will receive your results only by: ?MyChart Message (if you have MyChart) OR ?A paper copy in the mail ?If you have any lab test that is abnormal or we need to change your treatment, we will call you to review the results. ? ? ?Testing/Procedures: ? ?Your physician has requested that you have an echocardiogram. Echocardiography is a painless test that uses sound waves to create images of your heart. It provides your doctor with information about the size and shape of your heart and how well your heart?s chambers and valves are working. This procedure takes approximately one hour. There are no restrictions for this procedure. ? ?ZIO XT- Long Term Monitor Instructions ? ?Your physician has requested you wear a ZIO patch monitor for 14 days.  ?This is a single patch monitor. [employer] supplies one patch monitor per enrollment. Additional ?stickers are not available. Please do not apply patch if you will be having a Nuclear Stress Test,  ?Echocardiogram, Cardiac CT, MRI, or Chest Xray during the period you would be wearing the  ?monitor. The patch cannot be worn during these tests. You cannot remove and re-apply the  ?ZIO XT patch monitor.  ?Your ZIO patch monitor will be mailed 3 day USPS to your address on file. It may take 3-5 days  ?to receive your monitor after you have been enrolled.  ?Once you have received your monitor, please review the enclosed instructions. Your monitor  ?has already been registered assigning a specific monitor serial # to you. ? ?Billing and Patient Assistance Program Information ? ?We have  supplied [employer] with any of your insurance information on file for billing purposes. ?[employer] offers a sliding scale Patient Assistance Program for patients that do not have  ?insurance, or whose insurance does not completely cover the cost of the ZIO monitor.  ?You must apply for the Patient Assistance Program to qualify for this discounted rate.  ?To apply, please call [employer] at [phone], select option 4, select option 2, ask to apply for  ?Patient Assistance Program. Meredeth Ide will ask your household income, and how many people  ?are in your household. They will quote your out-of-pocket cost based on that information.  ?[employer] will also be able to set up a 28-month, interest-free payment plan if needed. ? ?Applying the monitor ?  ?Shave hair from upper left chest.  ?Hold abrader disc by orange tab. Rub abrader in 40 strokes over the upper left chest as  ?indicated in your monitor instructions.  ?Clean area with 4 enclosed alcohol pads. Let dry.  ?Apply patch as indicated in monitor instructions. Patch will be placed under collarbone on left  ?side of chest with arrow pointing upward.  ?Rub patch adhesive wings for 2 minutes. Remove white label marked "1". Remove the white  ?label marked "2". Rub patch adhesive wings for 2 additional minutes.  ?While looking in a mirror, press and release button in center of patch. A small green light will  ?flash 3-4 times. This will be your only indicator that the monitor has been turned on.  ?Do not shower for the first 24 hours. You may shower  after the first 24 hours.  ?Press the button if you feel a symptom. You will hear a small click. Record Date, Time and  ?Symptom in the Patient Logbook.  ?When you are ready to remove the patch, follow instructions on the last 2 pages of Patient  ?Logbook. Stick patch monitor onto the last page of Patient Logbook.  ?Place Patient Logbook in the blue and white box. Use locking tab on box and tape box closed  ?securely. The blue and  white box has prepaid postage on it. Please place it in the mailbox as  ?soon as possible. Your physician should have your test results approximately 7 days after the  ?monitor has been mailed back to [facility].  ?Call [facility] at [phone] if you have questions regarding  ?your ZIO XT patch monitor. Call them immediately if you see an orange light blinking on your  ?monitor.  ?If your monitor falls off in less than 4 days, contact our Monitor department at [phone].  ?If your monitor becomes loose or falls off after 4 days call [employer] at [phone] for  ?suggestions on securing your monitor  ? ? ?Follow-Up: ?At [facility], you and your health needs are our priority.  As part of our continuing mission to provide you with exceptional heart care, we have created designated Provider Care Teams.  These Care Teams include your primary Cardiologist (physician) and Advanced Practice Providers (APPs -  Physician Assistants and Nurse Practitioners) who all work together to provide you with the care you need, when you need it. ? ?We recommend signing up for the patient portal called "MyChart".  Sign up information is provided on this After Visit Summary.  MyChart is used to connect with patients for Virtual Visits (Telemedicine).  Patients are able to view lab/test results, encounter notes, upcoming appointments, etc.  Non-urgent messages can be sent to your provider as well.   ?To learn more about what you can do with MyChart, go to [email or web address].   ? ?Your next appointment:   ?12 months with Dr Graciela Husbands ?

## 2021-12-09 NOTE — Progress Notes (Signed)
? ? ? ? ?ELECTROPHYSIOLOGY OFFICE NOTE  ?Patient ID: Vickie Short, MRN: [id number], DOB/AGE: December 13, 1980 41 y.o. ?Admit date: (Not on file) ?Date of Consult: 12/09/2021 ? ?Primary Physician: Shon Hale, MD ?Primary Cardiologist:  ?  ?  ?Vickie Short is a 41 y.o. female who is being seen today for palpitations ? ?HPI ?Vickie Short is a 41 y.o. female Last seen 2016 for left atrial tachycardia which is mentioned in the notes that dates back to at least 2008.  Apparently there was no EP study, it must have been deduced from electrocardiograms. ? ?She has done really quite well.  Been under a lot of stress over the last couple of years since the death of her father and the progressive dementia of her mother.  She also is now the mother of 4.  She notes some dyspnea on exertion like climbing stairs.  Palpitations recur feeling like skips, can go on and off for minutes and is associated with some lightheadedness.  It also makes her quite anxious.  No chest pain. ?  ? ? ? ? ?Past Medical History:  ?Diagnosis Date  ? Atrial tachycardia (HCC)   ? left atrial  ? Dyspnea   ? associated with #1  ?   ? ?Surgical History: History reviewed. No pertinent surgical history.  ? ?Home Meds: ?Current Meds  ?Medication Sig  ? escitalopram (LEXAPRO) 10 MG tablet as needed.  ? Prenatal Vit-Fe Fumarate-FA (PRENATAL MULTIVITAMIN) TABS tablet Take 1 tablet by mouth daily at 12 noon.  ? propranolol (INDERAL) 10 MG tablet as needed.  ? ? ?Allergies: No Known Allergies ? ?Social History  ? ?Socioeconomic History  ? Marital status: Married  ?  Spouse name: Not on file  ? Number of children: Not on file  ? Years of education: Not on file  ? Highest education level: Not on file  ?Occupational History  ? Not on file  ?Tobacco Use  ? Smoking status: Never  ? Smokeless tobacco: Not on file  ?Substance and Sexual Activity  ? Alcohol use: No  ? Drug use: No  ? Sexual activity: Yes  ?Other Topics Concern  ? Not on file  ?Social History  Narrative  ? Not on file  ? ?Social Determinants of Health  ? ?Financial Resource Strain: Not on file  ?Food Insecurity: Not on file  ?Transportation Needs: Not on file  ?Physical Activity: Not on file  ?Stress: Not on file  ?Social Connections: Not on file  ?Intimate Partner Violence: Not on file  ?  ? ?Family History  ?Problem Relation Age of Onset  ? Atrial fibrillation Father   ?     s/p a failed atrial ablation procedure in 1992 by Dr. Moody Bruins  ? Hypertension Father   ? Cancer Maternal Grandmother   ? Cancer Paternal Grandmother   ?  ? ?ROS:  Please see the history of present illness.     All other systems reviewed and negative.  ? ? ?Physical Exam: ?Blood pressure 126/70, pulse 93, height 5\' 9"  (1.753 m), weight 179 lb 12.8 oz (81.6 kg), SpO2 99 %, not currently breastfeeding. ?General: Well developed, well nourished female in no acute distress. ?Head: Normocephalic, atraumatic, sclera non-icteric, no xanthomas, nares are without discharge. ?EENT: normal  ?Lymph Nodes:  none ?Neck: Negative for carotid bruits. JVD not elevated. ?Back:without scoliosis kyphosis ?Lungs: Clear bilaterally to auscultation without wheezes, rales, or rhonchi. Breathing is unlabored. ?Heart: RRR with S1 S2. No  murmur . No  rubs, or gallops appreciated. ?Abdomen: Soft, non-tender, non-distended with normoactive bowel sounds. No hepatomegaly. No rebound/guarding. No obvious abdominal masses. ?Msk:  Strength and tone appear normal for age. ?Extremities: No clubbing or cyanosis. No + edema.  Distal pedal pulses are 2+ and equal bilaterally. ?Skin: Warm and Dry ?Neuro: Alert and oriented X 3. CN III-XII intact Grossly normal sensory and motor function . ?Psych:  Responds to questions appropriately with a normal affect. ?  ?  ?  ? ?EKG: sinus @ 83 ?14/07/35 ? ? ?Assessment and Plan:  ?Palpitations ? ?Dyspnea on exertion ? ? ?Patient has persistent problems with palpitations which have been quiescient and now are active again.  We discussed  empiric therapy versus diagnostic testing i.e. monitoring versus nothing.  She would like to get clarity on whether the mechanisms are the same or changed and what therapeutic options she might have.  We will use a Zio patch. ? ?In addition, with her dyspnea on exertion, in the past was related to the palpitations less so now, we will check an echocardiogram.  Her weight has been stable over the 10 years apart from pregnancies. ? 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 ? ? ? ? ?Sherryl Manges  ?

## 2021-12-09 NOTE — Progress Notes (Unsigned)
Enrolled for [employer] to mail a ZIO XT long term holter monitor to the patients address on file.  

## 2021-12-11 DIAGNOSIS — I471 Supraventricular tachycardia: Secondary | ICD-10-CM

## 2021-12-11 DIAGNOSIS — R002 Palpitations: Secondary | ICD-10-CM

## 2021-12-20 ENCOUNTER — Other Ambulatory Visit: Payer: Self-pay | Admitting: Internal Medicine

## 2021-12-20 DIAGNOSIS — I471 Supraventricular tachycardia: Secondary | ICD-10-CM

## 2022-01-01 ENCOUNTER — Ambulatory Visit (INDEPENDENT_AMBULATORY_CARE_PROVIDER_SITE_OTHER): Payer: Managed Care, Other (non HMO)

## 2022-01-01 DIAGNOSIS — I4719 Other supraventricular tachycardia: Secondary | ICD-10-CM

## 2022-01-01 DIAGNOSIS — I471 Supraventricular tachycardia: Secondary | ICD-10-CM

## 2022-01-01 LAB — ECHOCARDIOGRAM COMPLETE
AR max vel: 3.44 cm2
AV Area VTI: 3.51 cm2
AV Area mean vel: 3.46 cm2
AV Mean grad: 4 mmHg
AV Peak grad: 6.4 mmHg
Ao pk vel: 1.26 m/s
Area-P 1/2: 2.22 cm2
Calc EF: 61.5 %
S' Lateral: 3.1 cm
Single Plane A2C EF: 57.7 %
Single Plane A4C EF: 63.7 %

## 2022-06-06 ENCOUNTER — Ambulatory Visit
Admission: RE | Admit: 2022-06-06 | Discharge: 2022-06-06 | Disposition: A | Discharge status: Home / Self Care | Payer: Managed Care, Other (non HMO) | Source: Ambulatory Visit | Attending: Obstetrics and Gynecology | Admitting: Obstetrics and Gynecology

## 2022-06-06 DIAGNOSIS — N6310 Unspecified lump in the right breast, unspecified quadrant: Secondary | ICD-10-CM

## 2023-04-24 ENCOUNTER — Other Ambulatory Visit: Payer: Self-pay | Admitting: Obstetrics and Gynecology

## 2023-04-24 DIAGNOSIS — N6310 Unspecified lump in the right breast, unspecified quadrant: Secondary | ICD-10-CM

## 2023-06-12 ENCOUNTER — Ambulatory Visit
Admission: RE | Admit: 2023-06-12 | Discharge: 2023-06-12 | Disposition: A | Discharge status: Home / Self Care | Payer: Managed Care, Other (non HMO) | Source: Ambulatory Visit | Attending: Obstetrics and Gynecology | Admitting: Obstetrics and Gynecology

## 2023-06-12 DIAGNOSIS — N6310 Unspecified lump in the right breast, unspecified quadrant: Secondary | ICD-10-CM

## 2023-08-09 IMAGING — US US BREAST*R* LIMITED INC AXILLA
1 series · 8 of 8 positions shown · non-contrast
Comparison: Previous exam(s).

CLINICAL DATA: Screening recall for a right breast asymmetry.

EXAM:
DIGITAL DIAGNOSTIC UNILATERAL RIGHT MAMMOGRAM WITH TOMOSYNTHESIS AND
CAD; ULTRASOUND RIGHT BREAST LIMITED
TECHNIQUE: Right digital diagnostic mammography and breast tomosynthesis was
performed. The images were evaluated with computer-aided detection.;
Targeted ultrasound examination of the right breast was performed

[Series 1: us breast*right* limited inc axilla · 0.06mm/px · 8 of 8 slices shown]
[im 1/8]
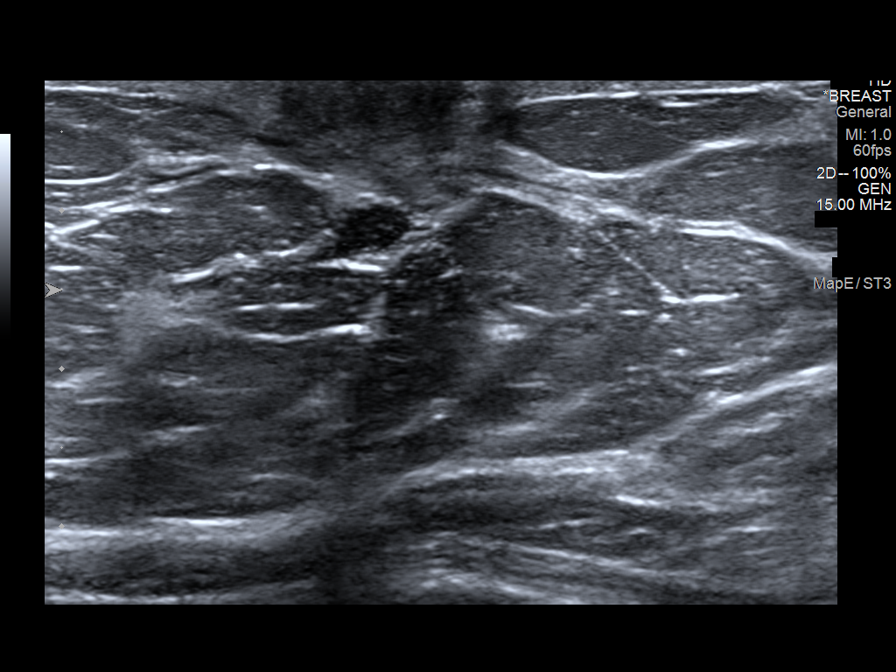
[im 2/8]
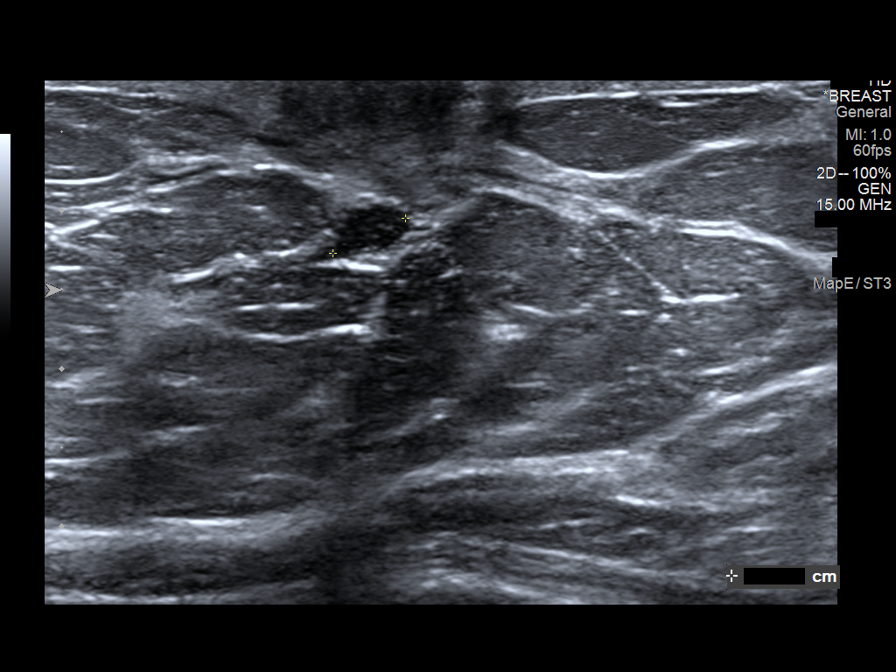
[im 3/8]
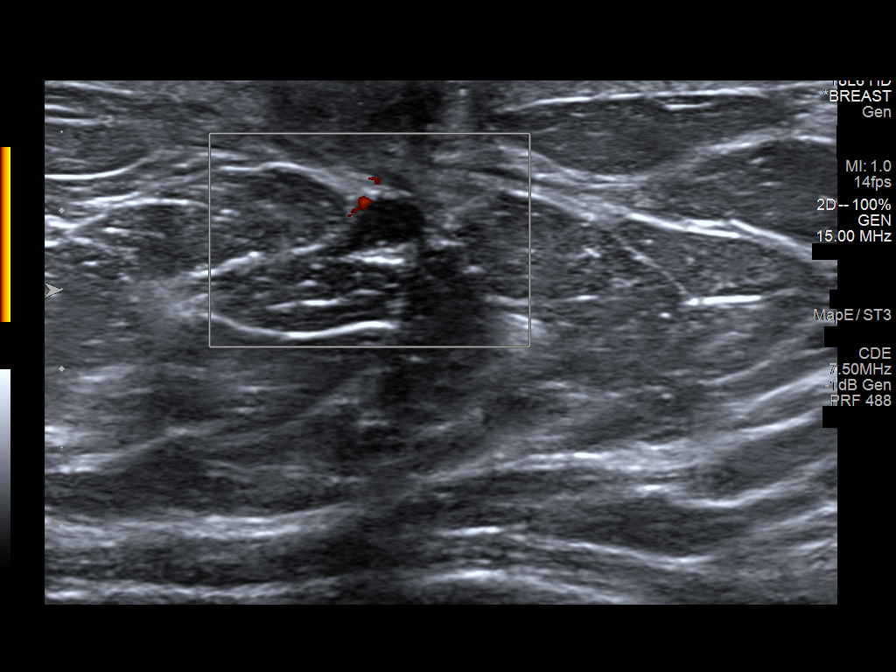
[im 4/8]
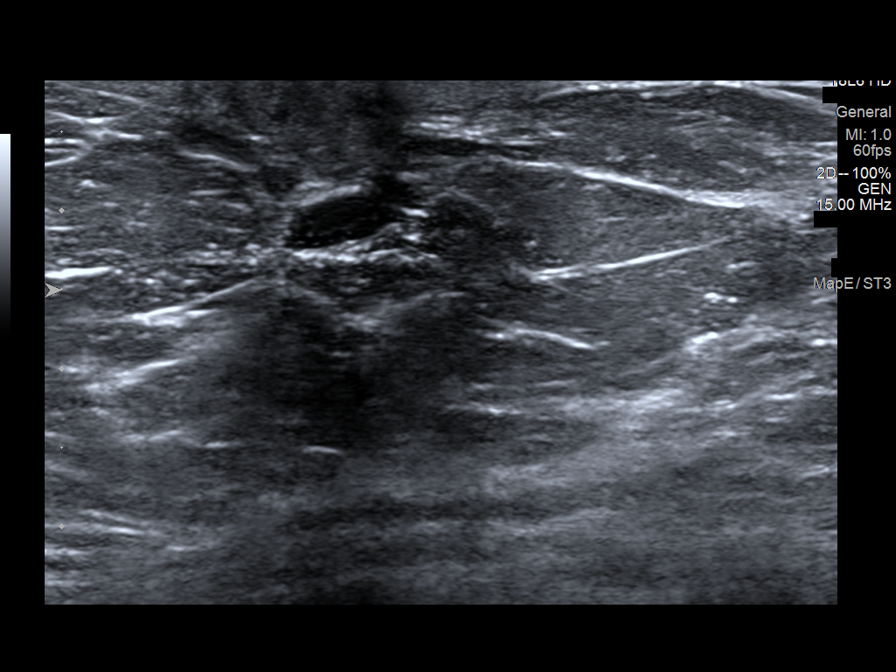
[im 5/8]
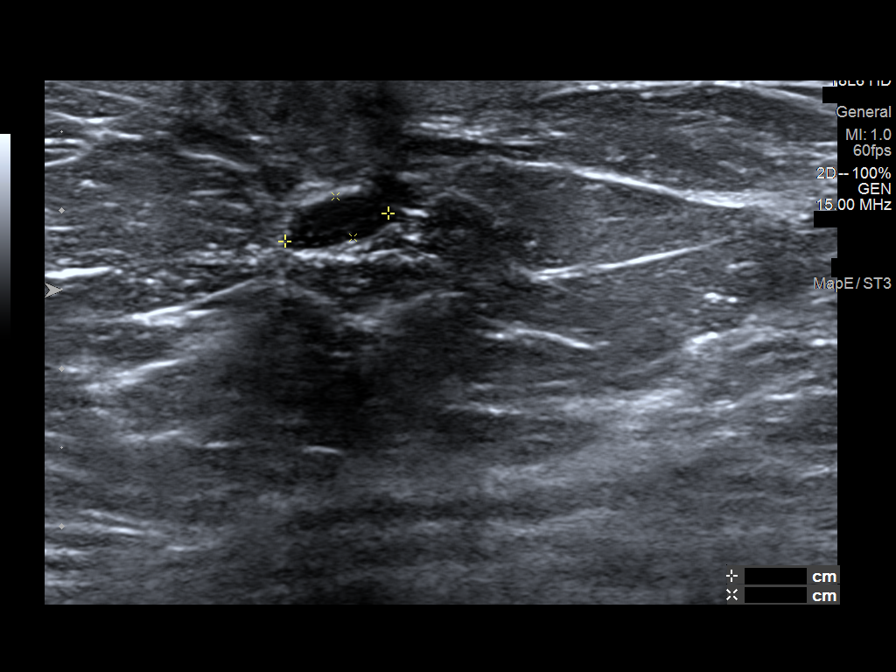
[im 6/8]
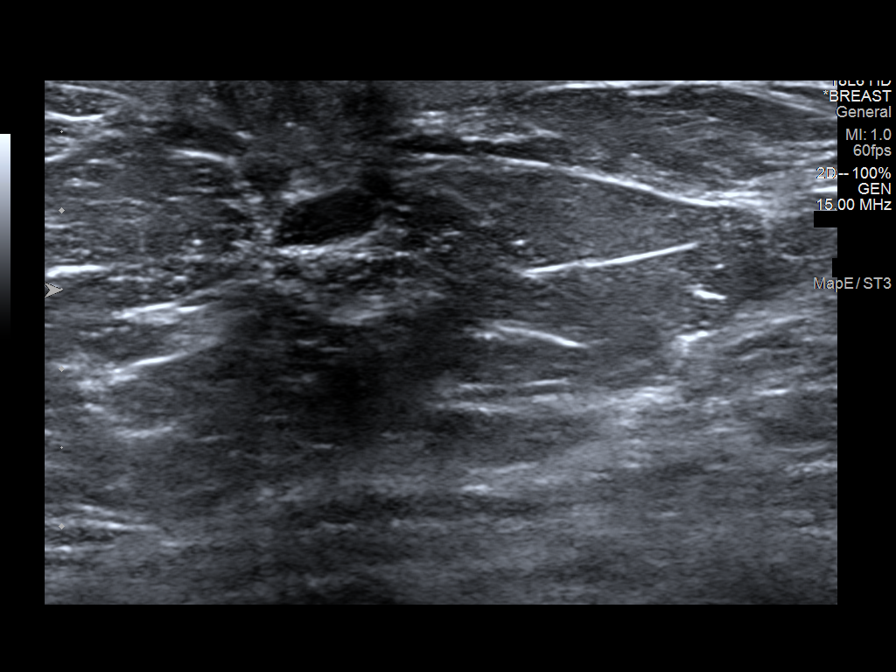
[im 7/8]
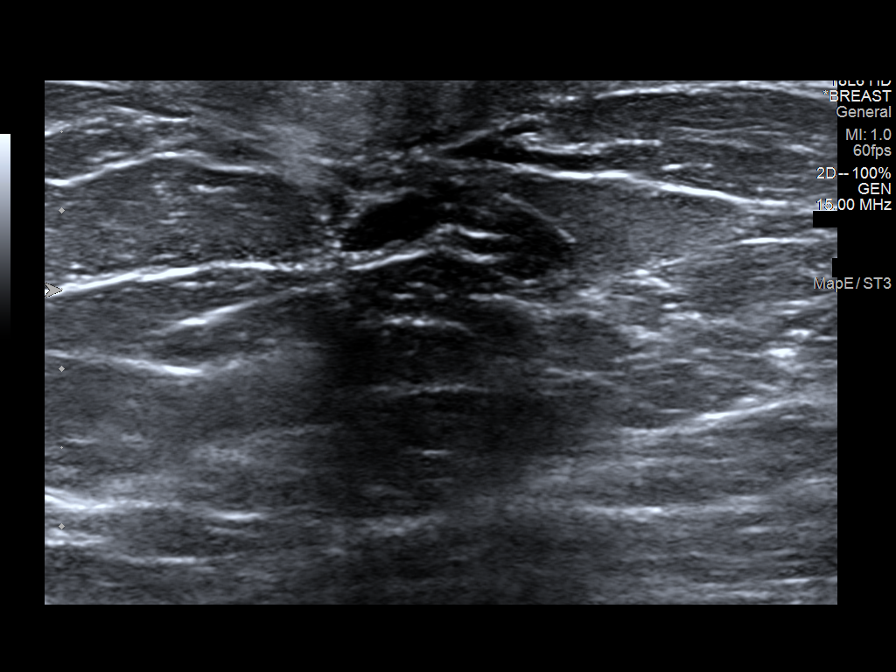
[im 8/8]
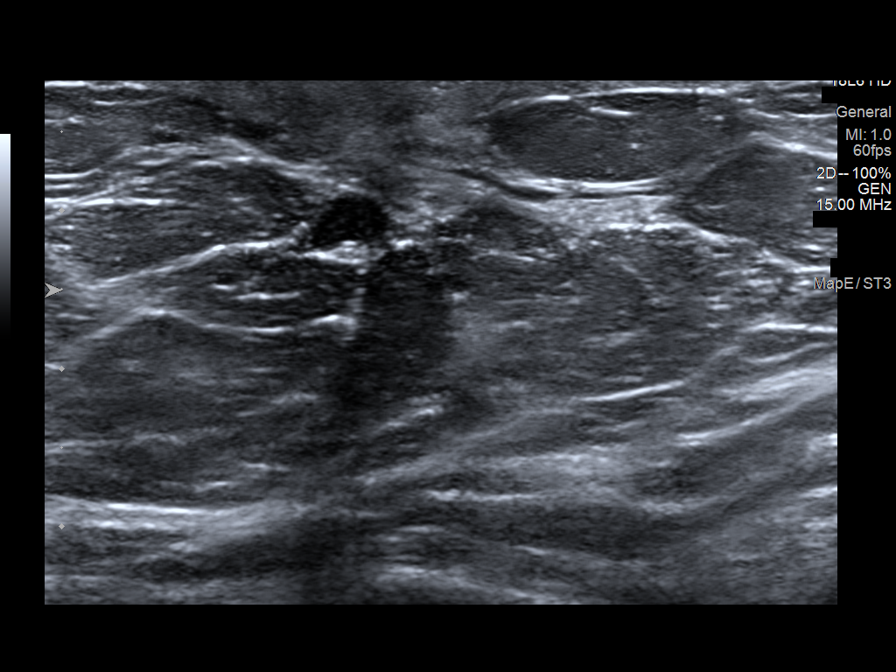

[8 of 8 positions shown; findings below may reference images not displayed]

ACR Breast Density Category b: There are scattered areas of
fibroglandular density.
FINDINGS: Spot compression tomosynthesis images through the retroareolar right
breast demonstrates a persistent oval circumscribed mass measuring 9
mm.

Ultrasound targeted to the retroareolar right breast demonstrates a
hypoechoic oval circumscribed mass measuring 7 x 3 x 5 mm. No blood
flow is seen within the mass on color Doppler imaging.
IMPRESSION: There is a likely benign retroareolar right breast mass measuring 7
mm. This may represent a focally dilated duct or complicated cyst.

RECOMMENDATION:
Six-month follow-up right breast ultrasound.

I have discussed the findings and recommendations with the patient.
If applicable, a reminder letter will be sent to the patient
regarding the next appointment.

BI-RADS CATEGORY  3: Probably benign.

## 2023-12-14 ENCOUNTER — Encounter: Payer: Self-pay | Admitting: Internal Medicine

## 2023-12-14 ENCOUNTER — Ambulatory Visit: Payer: Managed Care, Other (non HMO) | Attending: Internal Medicine | Admitting: Internal Medicine

## 2023-12-14 VITALS — BP 112/74 | HR 78 | Ht 69.0 in | Wt 187.8 lb

## 2023-12-14 DIAGNOSIS — I4719 Other supraventricular tachycardia: Secondary | ICD-10-CM | POA: Diagnosis not present

## 2023-12-14 MED ORDER — PROPRANOLOL HCL 10 MG PO TABS: 10.0000 mg | ORAL_TABLET | Freq: Every day | ORAL | 1 refills | Status: DC | PRN

## 2023-12-14 NOTE — Progress Notes (Signed)
 ELECTROPHYSIOLOGY OFFICE NOTE  Patient ID: Vickie Short, MRN: [id number], DOB/AGE: 1981/07/01 43 y.o. Admit date: (Not on file) Date of Consult: 12/14/2023  Primary Physician: Shon Hale, MD Primary Cardiologist:       HPI Vickie Short is a 43 y.o. female seen in follow-up for palpitations attributed to a left atrial tachycardia which is mentioned in the notes that dates back to at least 2008.  Apparently there was no EP study, it must have been deduced from electrocardiograms.  The patient denies chest pain, nocturnal dyspnea, orthopnea or peripheral edema so.  There have been, lightheadedness or syncope.  Complains of SOME palpitations infrequently about once a week typically last seconds associate with some transient nausea.   Continues with some dyspnea on exertion.  During COVID, she got herself quite fit dyspnea seem to be better.  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 No pertinent surgical history.   Home Meds: Current Meds  Medication Sig   Multiple Vitamins-Minerals (MULTIVITAMIN ADULTS PO) Take by mouth. One daily    Allergies: No Known Allergies  Social History   Socioeconomic History   Marital status: Married    Spouse name: Not on file   Number of children: Not on file   Years of education: Not on file   Highest education level: Not on file  Occupational History   Not on file  Tobacco Use   Smoking status: Never   Smokeless tobacco: Not on file  Substance and Sexual Activity   Alcohol use: No   Drug use: No   Sexual activity: Yes  Other Topics Concern   Not on file  Social History Narrative   Not on file   Social Drivers of Health   Financial Resource Strain: Not on file  Food Insecurity: Not on file   Transportation Needs: Not on file  Physical Activity: Not on file  Stress: Not on file  Social Connections: Not on file  Intimate Partner Violence: Not on file     Family History  Problem Relation Age of Onset   Atrial fibrillation Father        s/p a failed atrial ablation procedure in 1992 by Dr. Moody Bruins   Hypertension Father    Cancer Maternal Grandmother    Cancer Paternal Grandmother      ROS:  Please see the history of present illness.     All other systems reviewed and negative.   Physical Exam: BP 112/74   Pulse 78   Ht 5\' 9"  (1.753 m)   Wt 187 lb 12.8 oz (85.2 kg)   LMP 11/19/2023   SpO2 98%   BMI 27.73 kg/m   Well developed and nourished in no acute distress HENT normal Neck supple with JVP-  flat  Clear Regular rate and rhythm, no murmurs or gallops Abd-soft with active BS No Clubbing cyanosis edema Skin-warm and dry A & Oriented  Grossly normal sensory and motor function  ECG sinus at 78 14/08/37   Assessment and Plan:  Palpitations  Dyspnea on exertion   Palpitations continue to be intermittently a problem.  They are relatively brief, less than 5 or 10 seconds.  We will prescribe propranolol a refill  at as a as needed.  If they increase in frequency she will let us know.  Dyspnea remains common.  Have encouraged her to try to find a way to put some exercise into her very very busy schedule as her dyspnea had improved significantly with exercising during COVID      Sherryl Manges

## 2023-12-14 NOTE — Patient Instructions (Signed)
Medication Instructions:  °Your physician recommends that you continue on your current medications as directed. Please refer to the Current Medication list given to you today. ° °*If you need a refill on your cardiac medications before your next appointment, please call your pharmacy* ° ° °Lab Work: °None ordered ° ° °Testing/Procedures: °None ordered ° ° °Follow-Up: °At [facility], you and your health needs are our priority.  As part of our continuing mission to provide you with exceptional heart care, we have created designated Provider Care Teams.  These Care Teams include your primary Cardiologist (physician) and Advanced Practice Providers (APPs -  Physician Assistants and Nurse Practitioners) who all work together to provide you with the care you need, when you need it. ° °Your next appointment:   °as  needed ° °The format for your next appointment:   °In Person ° °Provider:   °Steven Klein, MD ° ° ° °Thank you for choosing [facility]!! ° ° ° °[phone] ° °  °

## 2024-01-09 ENCOUNTER — Other Ambulatory Visit: Payer: Self-pay | Admitting: Internal Medicine

## 2024-02-11 IMAGING — US US BREAST*R* LIMITED INC AXILLA
1 series · 5 of 5 positions shown · non-contrast
Comparison: Previous exam(s).

CLINICAL DATA: Six-month follow-up of a probably benign right
breast mass

EXAM:
ULTRASOUND OF THE RIGHT BREAST

[Series 1: us breast*right* limited inc axilla · 0.06mm/px · 5 of 5 slices shown]
[im 1/5]
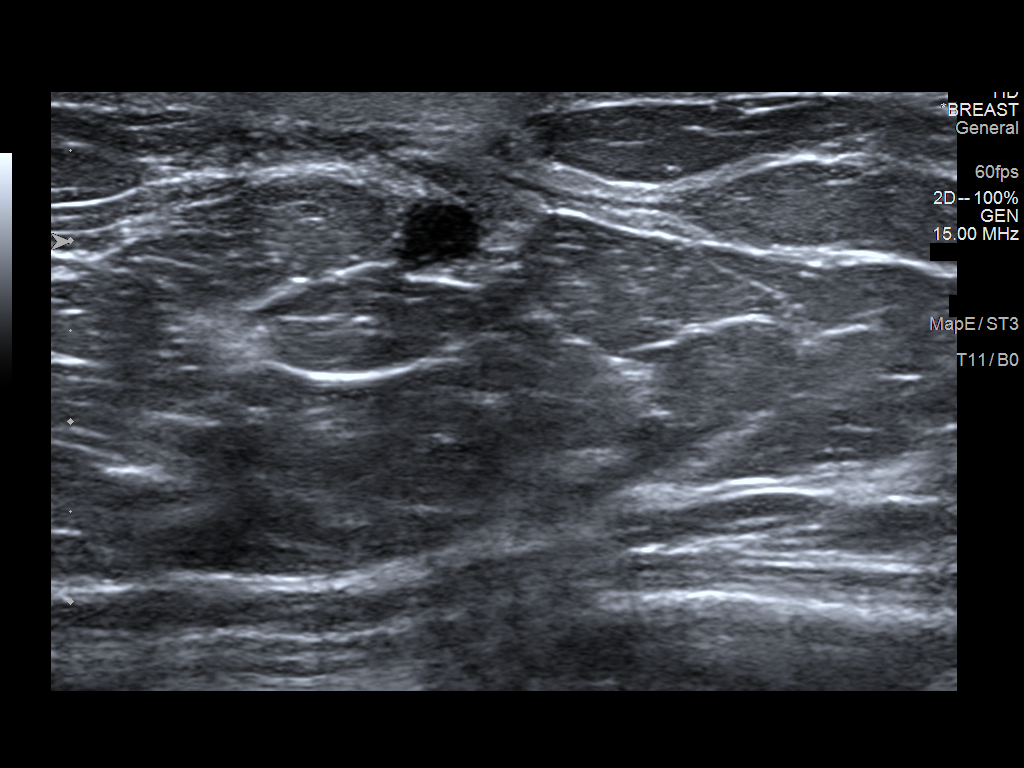
[im 2/5]
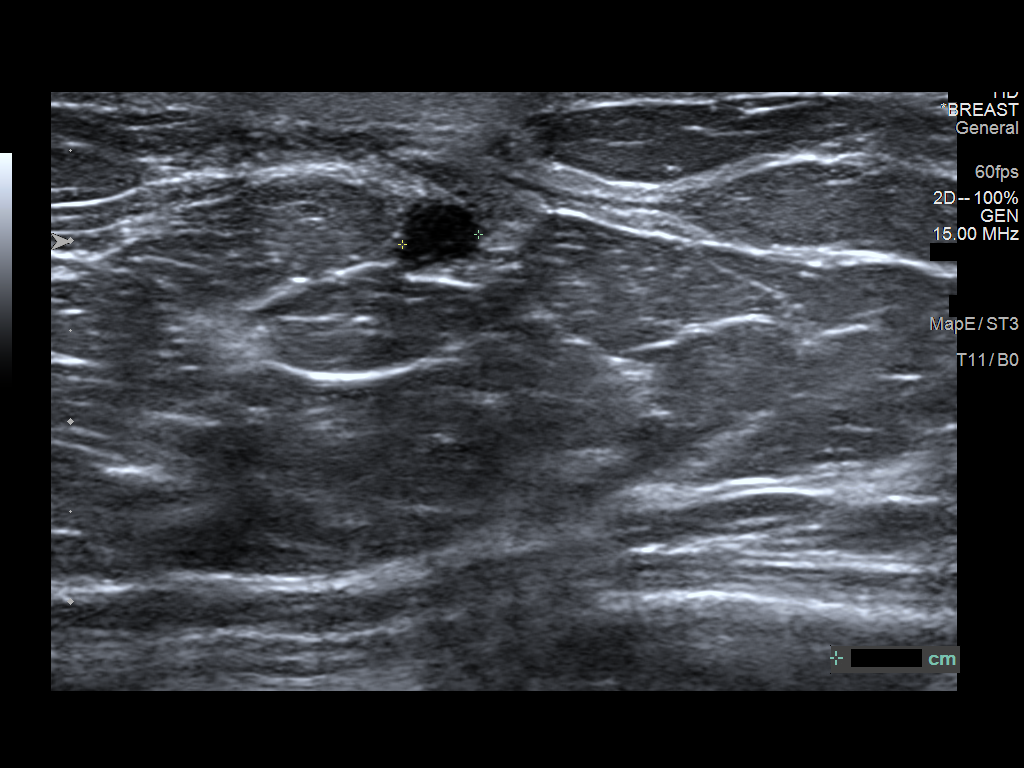
[im 3/5]
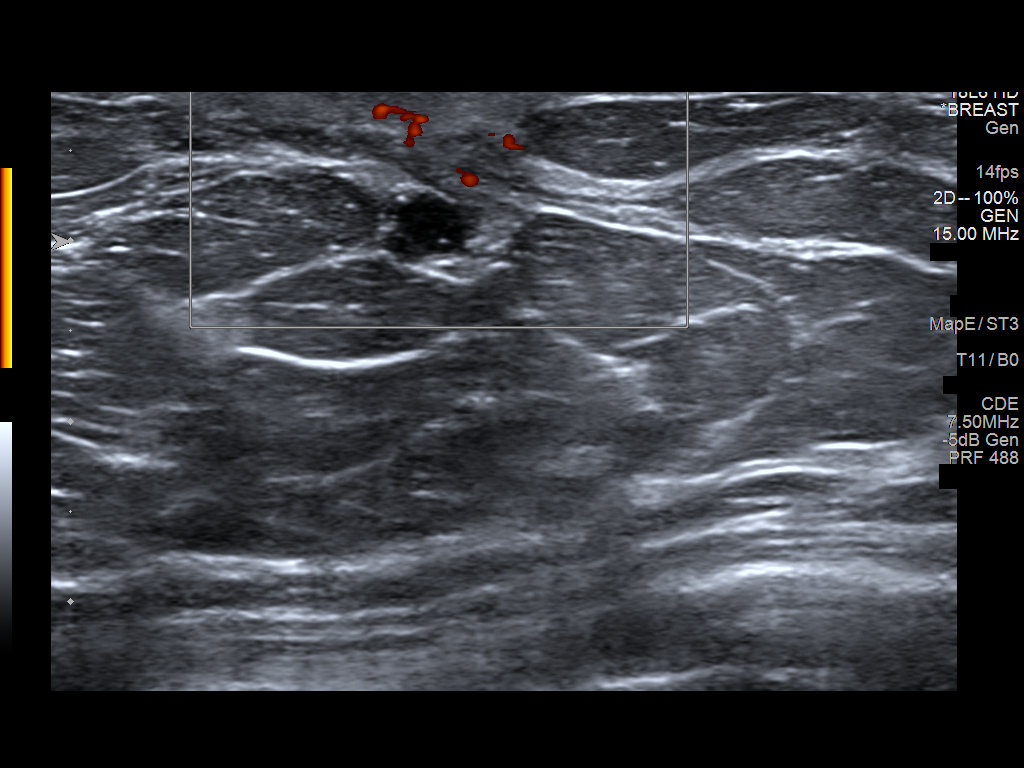
[im 4/5]
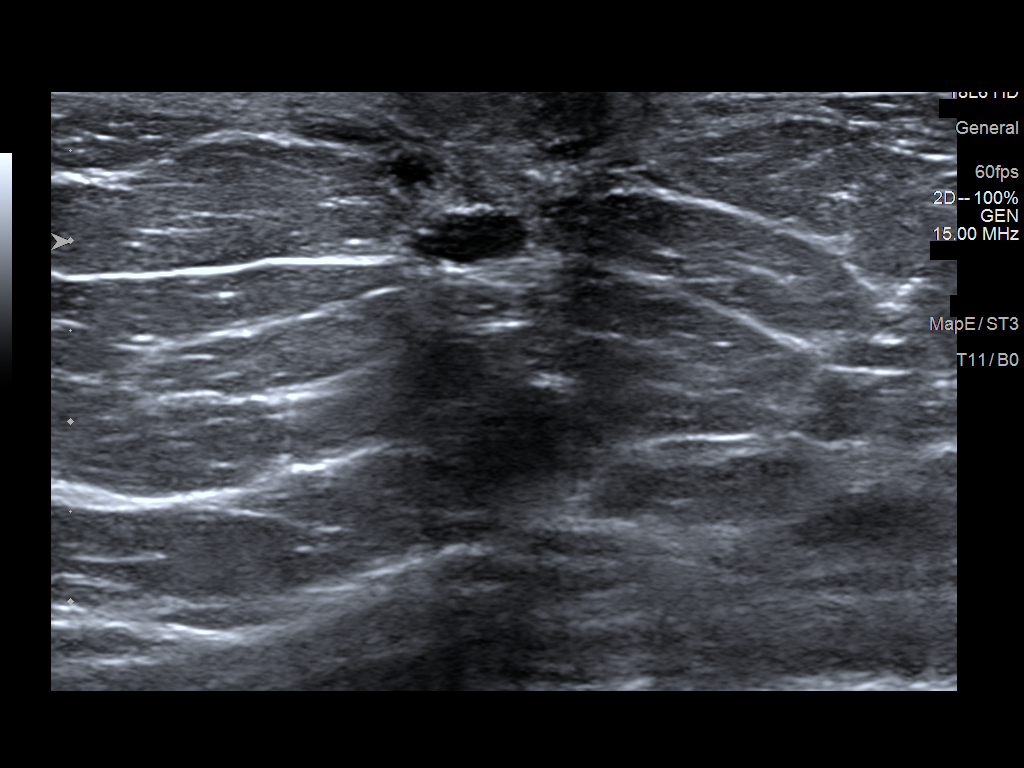
[im 5/5]
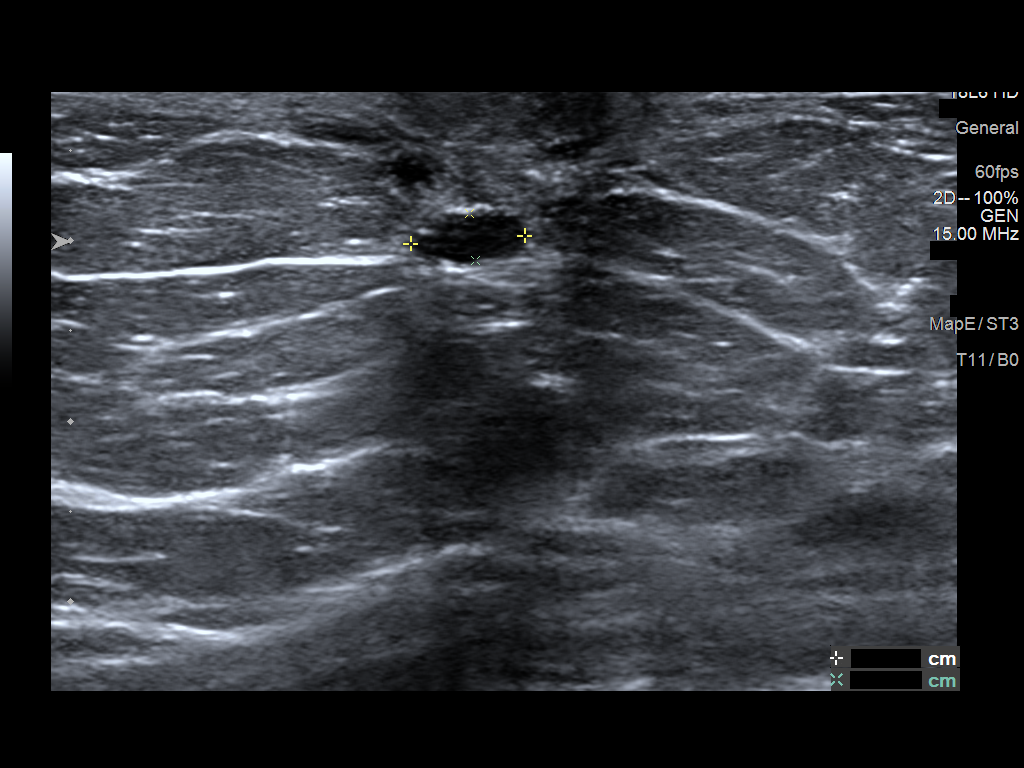

[5 of 5 positions shown; findings below may reference images not displayed]

FINDINGS: Targeted ultrasound is performed, showing the probably benign right
breast mass measures 4 x 6 x 3 mm today versus 5 by 7 x 3 mm
previously, not significantly changed.
IMPRESSION: Stable probably benign right breast mass.

RECOMMENDATION:
Six-month follow-up ultrasound of the probably benign right breast
mass. The patient will be due for bilateral mammography at that
time.

I have discussed the findings and recommendations with the patient.
If applicable, a reminder letter will be sent to the patient
regarding the next appointment.

BI-RADS CATEGORY  3: Probably benign.

## 2024-09-08 ENCOUNTER — Other Ambulatory Visit: Payer: Self-pay | Admitting: Obstetrics and Gynecology

## 2024-09-08 DIAGNOSIS — Z1231 Encounter for screening mammogram for malignant neoplasm of breast: Secondary | ICD-10-CM

## 2024-09-20 ENCOUNTER — Ambulatory Visit
Admission: RE | Admit: 2024-09-20 | Discharge: 2024-09-20 | Disposition: A | Discharge status: Home / Self Care | Source: Ambulatory Visit | Attending: Obstetrics and Gynecology | Admitting: Obstetrics and Gynecology

## 2024-09-20 DIAGNOSIS — Z1231 Encounter for screening mammogram for malignant neoplasm of breast: Secondary | ICD-10-CM

## 2024-09-28 ENCOUNTER — Other Ambulatory Visit: Payer: Self-pay | Admitting: Obstetrics and Gynecology

## 2024-09-28 DIAGNOSIS — R928 Other abnormal and inconclusive findings on diagnostic imaging of breast: Secondary | ICD-10-CM

## 2024-10-11 ENCOUNTER — Ambulatory Visit

## 2024-10-11 ENCOUNTER — Ambulatory Visit
Admission: RE | Admit: 2024-10-11 | Discharge: 2024-10-11 | Disposition: A | Source: Ambulatory Visit | Attending: Obstetrics and Gynecology | Admitting: Obstetrics and Gynecology

## 2024-10-11 DIAGNOSIS — R928 Other abnormal and inconclusive findings on diagnostic imaging of breast: Secondary | ICD-10-CM
